# Patient Record
Sex: Female | Born: 1935 | Race: Black or African American | Hispanic: No | Marital: Single | Smoking: Never smoker
Health system: Southern US, Community
[De-identification: ages and names within clinical notes are randomized; demographics above are authoritative.]

## PROBLEM LIST (undated history)

## (undated) DIAGNOSIS — E119 Type 2 diabetes mellitus without complications: Secondary | ICD-10-CM

## (undated) DIAGNOSIS — I10 Essential (primary) hypertension: Secondary | ICD-10-CM

## (undated) HISTORY — PX: EYE SURGERY: SHX253

## (undated) HISTORY — PX: ABDOMINAL HYSTERECTOMY: SHX81

---

## 2000-07-30 ENCOUNTER — Encounter: Admission: RE | Admit: 2000-07-30 | Discharge: 2000-07-30 | Payer: Self-pay | Admitting: Urology

## 2000-07-30 ENCOUNTER — Encounter: Payer: Self-pay | Admitting: Urology

## 2001-03-13 ENCOUNTER — Encounter: Payer: Self-pay | Admitting: Family Medicine

## 2001-03-13 ENCOUNTER — Ambulatory Visit (HOSPITAL_COMMUNITY): Admission: RE | Admit: 2001-03-13 | Discharge: 2001-03-13 | Payer: Self-pay | Admitting: Specialist

## 2001-05-22 ENCOUNTER — Ambulatory Visit (HOSPITAL_COMMUNITY): Admission: RE | Admit: 2001-05-22 | Discharge: 2001-05-22 | Payer: Self-pay | Admitting: General Surgery

## 2001-09-04 ENCOUNTER — Encounter: Payer: Self-pay | Admitting: Family Medicine

## 2001-09-04 ENCOUNTER — Ambulatory Visit (HOSPITAL_COMMUNITY): Admission: RE | Admit: 2001-09-04 | Discharge: 2001-09-04 | Payer: Self-pay | Admitting: Family Medicine

## 2002-02-11 ENCOUNTER — Encounter (HOSPITAL_COMMUNITY): Admission: RE | Admit: 2002-02-11 | Discharge: 2002-03-13 | Payer: Self-pay | Admitting: Family Medicine

## 2002-02-12 ENCOUNTER — Encounter: Payer: Self-pay | Admitting: Family Medicine

## 2002-03-14 ENCOUNTER — Encounter: Payer: Self-pay | Admitting: Family Medicine

## 2002-03-14 ENCOUNTER — Ambulatory Visit (HOSPITAL_COMMUNITY): Admission: RE | Admit: 2002-03-14 | Discharge: 2002-03-14 | Payer: Self-pay | Admitting: Family Medicine

## 2002-06-13 ENCOUNTER — Encounter: Payer: Self-pay | Admitting: Family Medicine

## 2002-06-14 ENCOUNTER — Inpatient Hospital Stay (HOSPITAL_COMMUNITY): Admission: AD | Admit: 2002-06-14 | Discharge: 2002-06-18 | Payer: Self-pay | Admitting: Family Medicine

## 2002-06-16 ENCOUNTER — Encounter: Payer: Self-pay | Admitting: Family Medicine

## 2002-10-14 ENCOUNTER — Ambulatory Visit (HOSPITAL_COMMUNITY): Admission: RE | Admit: 2002-10-14 | Discharge: 2002-10-14 | Payer: Self-pay | Admitting: Internal Medicine

## 2003-03-23 ENCOUNTER — Ambulatory Visit (HOSPITAL_COMMUNITY): Admission: RE | Admit: 2003-03-23 | Discharge: 2003-03-23 | Payer: Self-pay | Admitting: Specialist

## 2004-05-11 ENCOUNTER — Ambulatory Visit: Payer: Self-pay | Admitting: *Deleted

## 2004-05-23 ENCOUNTER — Ambulatory Visit (HOSPITAL_COMMUNITY): Admission: RE | Admit: 2004-05-23 | Discharge: 2004-05-23 | Payer: Self-pay | Admitting: Family Medicine

## 2004-05-24 ENCOUNTER — Ambulatory Visit: Payer: Self-pay

## 2004-06-09 ENCOUNTER — Ambulatory Visit: Payer: Self-pay | Admitting: *Deleted

## 2004-10-17 ENCOUNTER — Ambulatory Visit (HOSPITAL_COMMUNITY): Admission: RE | Admit: 2004-10-17 | Discharge: 2004-10-17 | Payer: Self-pay | Admitting: Internal Medicine

## 2004-12-21 ENCOUNTER — Ambulatory Visit: Payer: Self-pay | Admitting: *Deleted

## 2005-05-26 ENCOUNTER — Ambulatory Visit (HOSPITAL_COMMUNITY): Admission: RE | Admit: 2005-05-26 | Discharge: 2005-05-26 | Payer: Self-pay | Admitting: Family Medicine

## 2006-03-22 ENCOUNTER — Ambulatory Visit (HOSPITAL_COMMUNITY): Admission: RE | Admit: 2006-03-22 | Discharge: 2006-03-22 | Payer: Self-pay | Admitting: Internal Medicine

## 2010-08-23 ENCOUNTER — Other Ambulatory Visit: Payer: Self-pay | Admitting: Family Medicine

## 2010-08-23 ENCOUNTER — Ambulatory Visit
Admission: RE | Admit: 2010-08-23 | Discharge: 2010-08-23 | Disposition: A | Discharge status: Home / Self Care | Payer: Medicare Other | Source: Ambulatory Visit | Attending: Family Medicine | Admitting: Family Medicine

## 2010-08-23 DIAGNOSIS — R52 Pain, unspecified: Secondary | ICD-10-CM

## 2010-08-31 ENCOUNTER — Other Ambulatory Visit: Payer: Self-pay | Admitting: Family Medicine

## 2010-08-31 DIAGNOSIS — M25562 Pain in left knee: Secondary | ICD-10-CM

## 2010-09-03 ENCOUNTER — Ambulatory Visit
Admission: RE | Admit: 2010-09-03 | Discharge: 2010-09-03 | Disposition: A | Discharge status: Home / Self Care | Payer: Medicare Other | Source: Ambulatory Visit | Attending: Family Medicine | Admitting: Family Medicine

## 2010-09-03 DIAGNOSIS — M25562 Pain in left knee: Secondary | ICD-10-CM

## 2012-04-26 ENCOUNTER — Emergency Department (HOSPITAL_COMMUNITY): Payer: Medicare Other

## 2012-04-26 ENCOUNTER — Encounter (HOSPITAL_COMMUNITY): Payer: Self-pay | Admitting: Emergency Medicine

## 2012-04-26 ENCOUNTER — Emergency Department (HOSPITAL_COMMUNITY)
Admission: EM | Admit: 2012-04-26 | Discharge: 2012-04-26 | Disposition: A | Discharge status: Home / Self Care | Payer: Medicare Other | Attending: Emergency Medicine | Admitting: Emergency Medicine

## 2012-04-26 DIAGNOSIS — Y93I9 Activity, other involving external motion: Secondary | ICD-10-CM | POA: Insufficient documentation

## 2012-04-26 DIAGNOSIS — Y92410 Unspecified street and highway as the place of occurrence of the external cause: Secondary | ICD-10-CM | POA: Insufficient documentation

## 2012-04-26 DIAGNOSIS — S20219A Contusion of unspecified front wall of thorax, initial encounter: Secondary | ICD-10-CM | POA: Insufficient documentation

## 2012-04-26 DIAGNOSIS — S99919A Unspecified injury of unspecified ankle, initial encounter: Secondary | ICD-10-CM

## 2012-04-26 DIAGNOSIS — E119 Type 2 diabetes mellitus without complications: Secondary | ICD-10-CM | POA: Insufficient documentation

## 2012-04-26 DIAGNOSIS — Z79899 Other long term (current) drug therapy: Secondary | ICD-10-CM | POA: Insufficient documentation

## 2012-04-26 DIAGNOSIS — I10 Essential (primary) hypertension: Secondary | ICD-10-CM | POA: Insufficient documentation

## 2012-04-26 DIAGNOSIS — S8990XA Unspecified injury of unspecified lower leg, initial encounter: Secondary | ICD-10-CM | POA: Insufficient documentation

## 2012-04-26 HISTORY — DX: Essential (primary) hypertension: I10

## 2012-04-26 HISTORY — DX: Type 2 diabetes mellitus without complications: E11.9

## 2012-04-26 MED ORDER — HYDROCODONE-ACETAMINOPHEN 5-325 MG PO TABS: 1.0000 | ORAL_TABLET | Freq: Four times a day (QID) | ORAL | Status: DC | PRN

## 2012-04-26 NOTE — Discharge Instructions (Signed)
Follow up with your md next week if needed °
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
## 2012-05-13 ENCOUNTER — Other Ambulatory Visit: Payer: Self-pay | Admitting: Family Medicine

## 2012-05-13 DIAGNOSIS — R609 Edema, unspecified: Secondary | ICD-10-CM

## 2012-05-14 ENCOUNTER — Ambulatory Visit
Admission: RE | Admit: 2012-05-14 | Discharge: 2012-05-14 | Disposition: A | Discharge status: Home / Self Care | Payer: Medicare Other | Source: Ambulatory Visit | Attending: Family Medicine | Admitting: Family Medicine

## 2012-05-14 ENCOUNTER — Other Ambulatory Visit: Payer: Self-pay

## 2012-05-14 ENCOUNTER — Other Ambulatory Visit: Payer: Self-pay | Admitting: Family Medicine

## 2012-05-14 DIAGNOSIS — R52 Pain, unspecified: Secondary | ICD-10-CM

## 2012-06-22 IMAGING — CR DG KNEE 1-2V*L*
2 series · 2 of 2 positions shown · non-contrast
Comparison: None

CLINICAL DATA: Pain

LEFT KNEE - 1-2 VIEW

[t knee ap left]
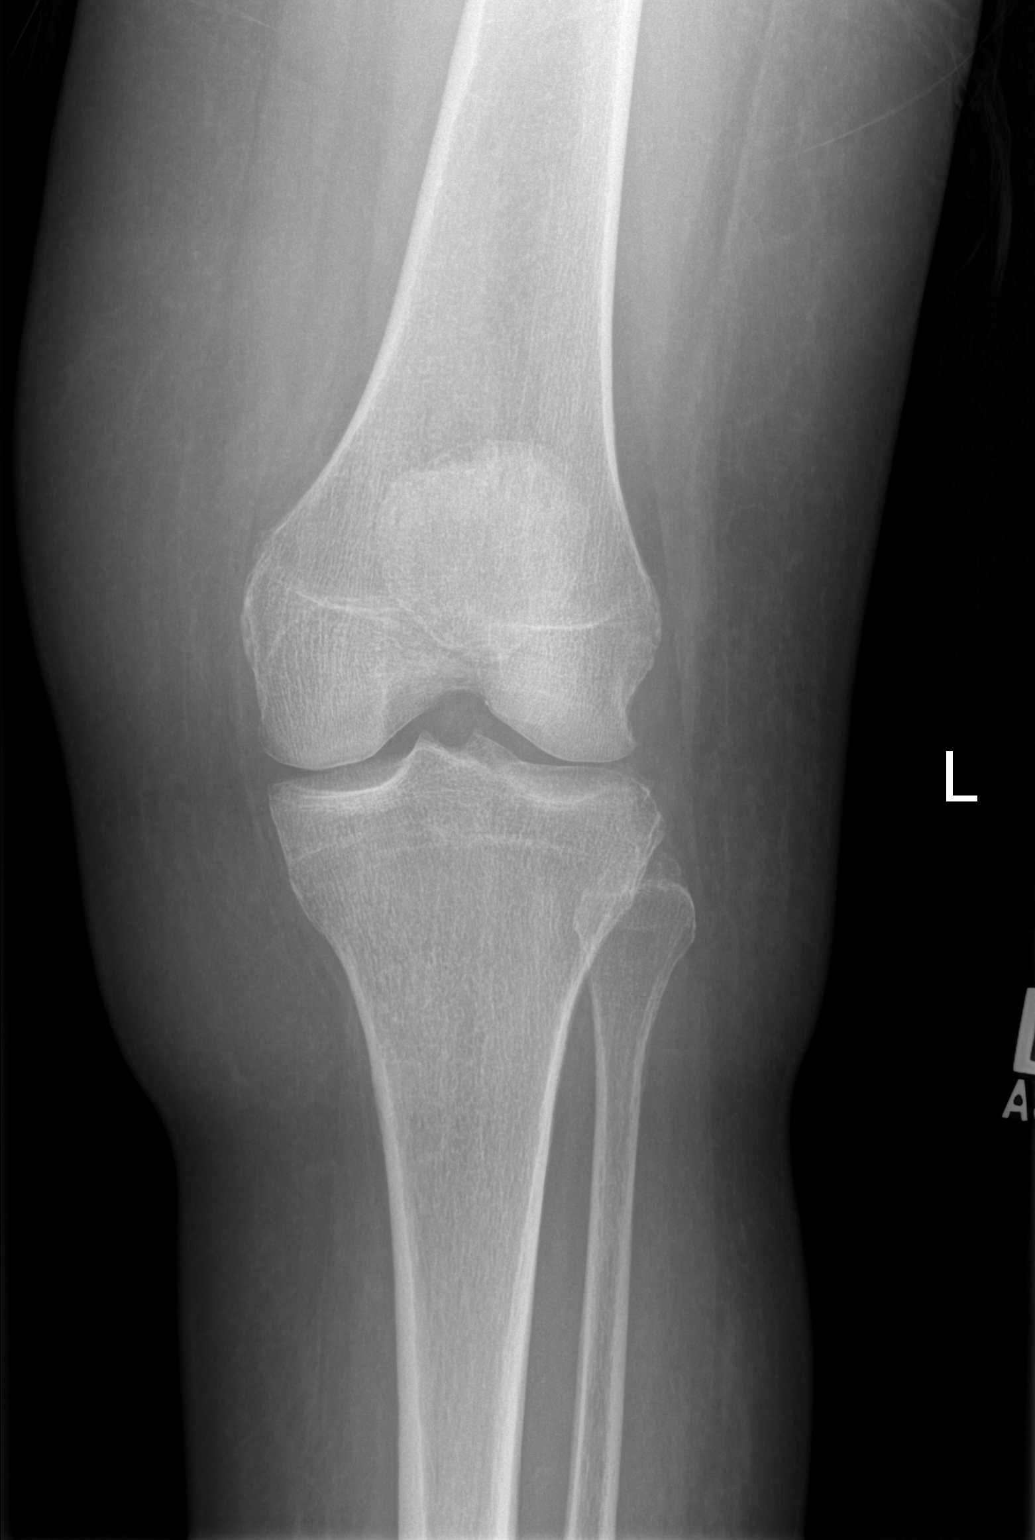

[t knee oblique left]
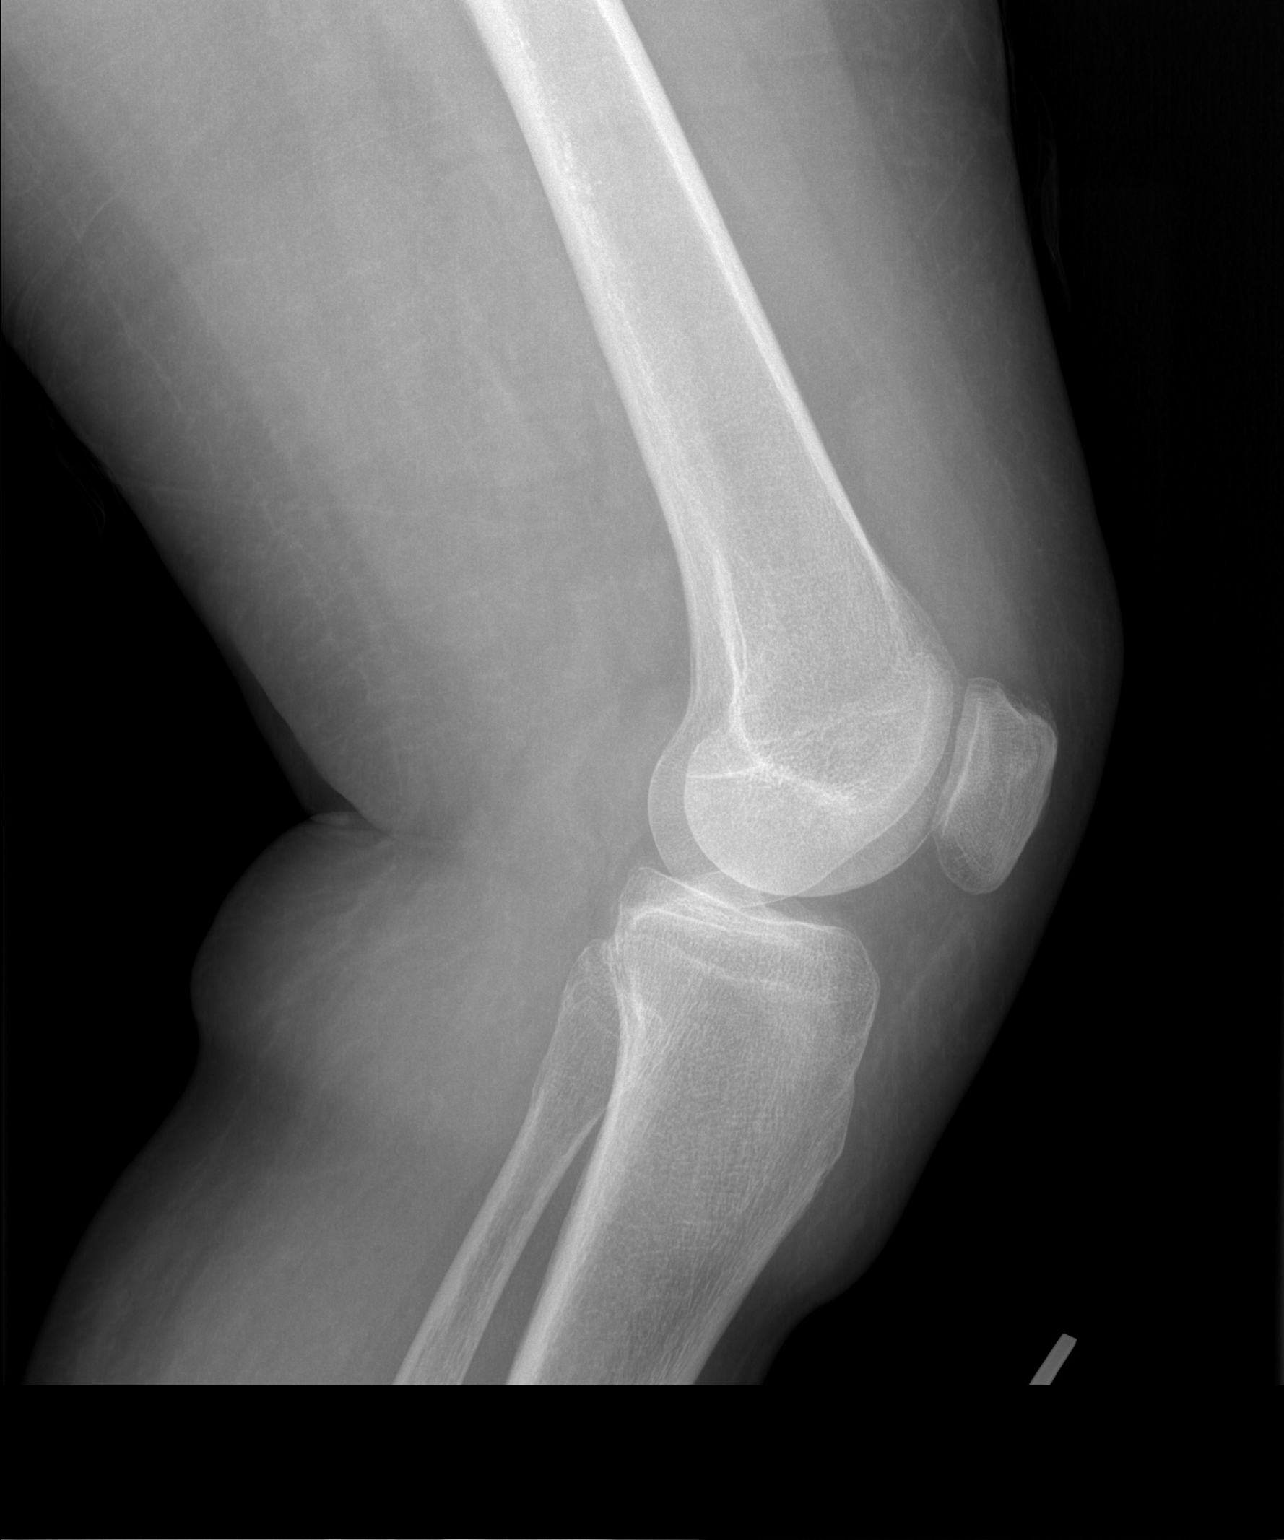

[2 of 2 positions shown; findings below may reference images not displayed]

FINDINGS: There is mild osteophytosis.  The joint compartments are
maintained.  There is no evidence of fracture, dislocation, or
subchondral lesion.  No gross suprapatellar effusion is noted.
IMPRESSION: Mild osteophytosis.  If there is clinical concern regarding a
meniscal or ligamentous injury, MRI may be of help.

## 2014-04-23 ENCOUNTER — Emergency Department (HOSPITAL_COMMUNITY): Payer: Medicare Other

## 2014-04-23 ENCOUNTER — Emergency Department (HOSPITAL_COMMUNITY)
Admission: EM | Admit: 2014-04-23 | Discharge: 2014-04-23 | Disposition: A | Discharge status: Home / Self Care | Payer: Medicare Other | Attending: Emergency Medicine | Admitting: Emergency Medicine

## 2014-04-23 ENCOUNTER — Encounter (HOSPITAL_COMMUNITY): Payer: Self-pay | Admitting: Family Medicine

## 2014-04-23 DIAGNOSIS — E119 Type 2 diabetes mellitus without complications: Secondary | ICD-10-CM | POA: Diagnosis not present

## 2014-04-23 DIAGNOSIS — N390 Urinary tract infection, site not specified: Secondary | ICD-10-CM | POA: Insufficient documentation

## 2014-04-23 DIAGNOSIS — R1012 Left upper quadrant pain: Secondary | ICD-10-CM | POA: Insufficient documentation

## 2014-04-23 DIAGNOSIS — R109 Unspecified abdominal pain: Secondary | ICD-10-CM

## 2014-04-23 DIAGNOSIS — R1110 Vomiting, unspecified: Secondary | ICD-10-CM | POA: Diagnosis present

## 2014-04-23 DIAGNOSIS — I10 Essential (primary) hypertension: Secondary | ICD-10-CM | POA: Diagnosis not present

## 2014-04-23 DIAGNOSIS — R1030 Lower abdominal pain, unspecified: Secondary | ICD-10-CM

## 2014-04-23 LAB — COMPREHENSIVE METABOLIC PANEL
ALK PHOS: 71 U/L (ref 39–117)
ALT: 19 U/L (ref 0–35)
AST: 32 U/L (ref 0–37)
Albumin: 3.7 g/dL (ref 3.5–5.2)
Anion gap: 8 (ref 5–15)
BUN: 14 mg/dL (ref 6–23)
CHLORIDE: 105 meq/L (ref 96–112)
CO2: 27 mmol/L (ref 19–32)
Calcium: 9.2 mg/dL (ref 8.4–10.5)
Creatinine, Ser: 0.67 mg/dL (ref 0.50–1.10)
GFR, EST AFRICAN AMERICAN: 89 mL/min — AB (ref >90)
GFR, EST NON AFRICAN AMERICAN: 77 mL/min — AB (ref >90)
GLUCOSE: 174 mg/dL — AB (ref 70–99)
Potassium: 3.6 mmol/L (ref 3.5–5.1)
SODIUM: 140 mmol/L (ref 135–145)
TOTAL PROTEIN: 7.5 g/dL (ref 6.0–8.3)
Total Bilirubin: 0.5 mg/dL (ref 0.3–1.2)

## 2014-04-23 LAB — URINE MICROSCOPIC-ADD ON

## 2014-04-23 LAB — URINALYSIS, ROUTINE W REFLEX MICROSCOPIC
Bilirubin Urine: NEGATIVE
GLUCOSE, UA: NEGATIVE mg/dL
KETONES UR: NEGATIVE mg/dL
LEUKOCYTES UA: NEGATIVE
Nitrite: POSITIVE — AB
PROTEIN: NEGATIVE mg/dL
Specific Gravity, Urine: 1.021 (ref 1.005–1.030)
Urobilinogen, UA: 0.2 mg/dL (ref 0.0–1.0)
pH: 6 (ref 5.0–8.0)

## 2014-04-23 LAB — CBC WITH DIFFERENTIAL/PLATELET
BASOS ABS: 0 10*3/uL (ref 0.0–0.1)
BASOS PCT: 0 % (ref 0–1)
Eosinophils Absolute: 0 10*3/uL (ref 0.0–0.7)
Eosinophils Relative: 0 % (ref 0–5)
HCT: 38.9 % (ref 36.0–46.0)
Hemoglobin: 13 g/dL (ref 12.0–15.0)
LYMPHS PCT: 7 % — AB (ref 12–46)
Lymphs Abs: 0.8 10*3/uL (ref 0.7–4.0)
MCH: 29.7 pg (ref 26.0–34.0)
MCHC: 33.4 g/dL (ref 30.0–36.0)
MCV: 88.8 fL (ref 78.0–100.0)
MONO ABS: 0.8 10*3/uL (ref 0.1–1.0)
Monocytes Relative: 7 % (ref 3–12)
NEUTROS ABS: 9.8 10*3/uL — AB (ref 1.7–7.7)
NEUTROS PCT: 86 % — AB (ref 43–77)
PLATELETS: 225 10*3/uL (ref 150–400)
RBC: 4.38 MIL/uL (ref 3.87–5.11)
RDW: 13.5 % (ref 11.5–15.5)
WBC: 11.4 10*3/uL — AB (ref 4.0–10.5)

## 2014-04-23 MED ORDER — CEPHALEXIN 500 MG PO CAPS: 500.0000 mg | ORAL_CAPSULE | Freq: Four times a day (QID) | ORAL | Status: DC

## 2014-04-23 MED ORDER — ONDANSETRON 4 MG PO TBDP
8.0000 mg | ORAL_TABLET | Freq: Once | ORAL | Status: AC
  Administered 2014-04-23: 8 mg via ORAL
  Filled 2014-04-23: qty 2

## 2014-04-23 MED ORDER — IOHEXOL 300 MG/ML  SOLN
25.0000 mL | Freq: Once | INTRAMUSCULAR | Status: AC | PRN
  Administered 2014-04-23: 25 mL via ORAL

## 2014-04-23 MED ORDER — IOHEXOL 300 MG/ML  SOLN
100.0000 mL | Freq: Once | INTRAMUSCULAR | Status: AC | PRN
  Administered 2014-04-23: 100 mL via INTRAVENOUS

## 2014-04-23 MED ORDER — SODIUM CHLORIDE 0.9 % IV BOLUS (SEPSIS)
1000.0000 mL | Freq: Once | INTRAVENOUS | Status: AC
  Administered 2014-04-23: 1000 mL via INTRAVENOUS

## 2014-04-23 MED ORDER — ONDANSETRON HCL 4 MG PO TABS: 4.0000 mg | ORAL_TABLET | Freq: Four times a day (QID) | ORAL | Status: DC

## 2014-04-23 NOTE — ED Notes (Signed)
Pt sts lower abd pain and vomiting since this am. Denies diarrhea.

## 2014-04-23 NOTE — ED Provider Notes (Signed)
CSN: [id number]     Arrival date & time 04/23/14  1219 History   First MD Initiated Contact with Patient 04/23/14 1503     Chief Complaint  Patient presents with  . Emesis     (Consider location/radiation/quality/duration/timing/severity/associated sxs/prior Treatment) HPI Comments: Patient with PMH of HTN and DM presents to the ED with a chief complaint of abdominal pain and vomiting.  She states that the symptoms started on Christmas Eve and lasted for the entire day.  She states that the symptoms then improved until today.  She reports multiple episodes of non-bloody vomiting.  She reports associated lower abdominal pain.  She denies any chest pain, SOB, cough, diarrhea, constipation, or dysuria.  She has taken zofran in triage today, but otherwise nothing for her symptoms.  She denies any hx of abdominal surgery.  She states that the pain is moderate.  There are no other associated symptoms.  The history is provided by the patient. No language interpreter was used.    Past Medical History  Diagnosis Date  . Hypertension   . Diabetes mellitus without complication    Past Surgical History  Procedure Laterality Date  . Abdominal hysterectomy     History reviewed. No pertinent family history. History  Substance Use Topics  . Smoking status: Never Smoker   . Smokeless tobacco: Not on file  . Alcohol Use: No   OB History    No data available     Review of Systems  Constitutional: Negative for fever and chills.  Respiratory: Negative for shortness of breath.   Cardiovascular: Negative for chest pain.  Gastrointestinal: Positive for nausea, vomiting and abdominal pain. Negative for diarrhea, constipation and abdominal distention.  Genitourinary: Negative for dysuria.  All other systems reviewed and are negative.     Allergies  Ace inhibitors  Home Medications   Prior to Admission medications   Not on File   BP 180/85 mmHg  Pulse 92  Temp(Src) 97.9 F (36.6 C)  (Oral)  Resp 18  SpO2 97% Physical Exam  Constitutional: She is oriented to person, place, and time. She appears well-developed and well-nourished.  HENT:  Head: Normocephalic and atraumatic.  Eyes: Conjunctivae and EOM are normal. Pupils are equal, round, and reactive to light.  Neck: Normal range of motion. Neck supple.  Cardiovascular: Normal rate and regular rhythm.  Exam reveals no gallop and no friction rub.   No murmur heard. Pulmonary/Chest: Effort normal and breath sounds normal. No respiratory distress. She has no wheezes. She has no rales. She exhibits no tenderness.  Abdominal: Soft. Bowel sounds are normal. She exhibits no distension and no mass. There is tenderness. There is no rebound and no guarding.  Moderate bilateral lower abdominal tenderness, moderate LUQ tenderness  Musculoskeletal: Normal range of motion. She exhibits edema. She exhibits no tenderness.  Bilateral lower extremity edema (baseline x years)  Neurological: She is alert and oriented to person, place, and time.  Skin: Skin is warm and dry.  Psychiatric: She has a normal mood and affect. Her behavior is normal. Judgment and thought content normal.  Nursing note and vitals reviewed.   ED Course  Procedures (including critical care time) Results for orders placed or performed during the hospital encounter of 04/23/14  CBC with Differential  Result Value Ref Range   WBC 11.4 (H) 4.0 - 10.5 K/uL   RBC 4.38 3.87 - 5.11 MIL/uL   Hemoglobin 13.0 12.0 - 15.0 g/dL   HCT 08.6 57.8 - 46.9 %  MCV 88.8 78.0 - 100.0 fL   MCH 29.7 26.0 - 34.0 pg   MCHC 33.4 30.0 - 36.0 g/dL   RDW 84.113.5 32.411.5 - 40.115.5 %   Platelets 225 150 - 400 K/uL   Neutrophils Relative % 86 (H) 43 - 77 %   Neutro Abs 9.8 (H) 1.7 - 7.7 K/uL   Lymphocytes Relative 7 (L) 12 - 46 %   Lymphs Abs 0.8 0.7 - 4.0 K/uL   Monocytes Relative 7 3 - 12 %   Monocytes Absolute 0.8 0.1 - 1.0 K/uL   Eosinophils Relative 0 0 - 5 %   Eosinophils Absolute 0.0  0.0 - 0.7 K/uL   Basophils Relative 0 0 - 1 %   Basophils Absolute 0.0 0.0 - 0.1 K/uL  Comprehensive metabolic panel  Result Value Ref Range   Sodium 140 135 - 145 mmol/L   Potassium 3.6 3.5 - 5.1 mmol/L   Chloride 105 96 - 112 mEq/L   CO2 27 19 - 32 mmol/L   Glucose, Bld 174 (H) 70 - 99 mg/dL   BUN 14 6 - 23 mg/dL   Creatinine, Ser 0.270.67 0.50 - 1.10 mg/dL   Calcium 9.2 8.4 - 25.310.5 mg/dL   Total Protein 7.5 6.0 - 8.3 g/dL   Albumin 3.7 3.5 - 5.2 g/dL   AST 32 0 - 37 U/L   ALT 19 0 - 35 U/L   Alkaline Phosphatase 71 39 - 117 U/L   Total Bilirubin 0.5 0.3 - 1.2 mg/dL   GFR calc non Af Amer 77 (L) >90 mL/min   GFR calc Af Amer 89 (L) >90 mL/min   Anion gap 8 5 - 15  Urinalysis, Routine w reflex microscopic  Result Value Ref Range   Color, Urine YELLOW YELLOW   APPearance HAZY (A) CLEAR   Specific Gravity, Urine 1.021 1.005 - 1.030   pH 6.0 5.0 - 8.0   Glucose, UA NEGATIVE NEGATIVE mg/dL   Hgb urine dipstick SMALL (A) NEGATIVE   Bilirubin Urine NEGATIVE NEGATIVE   Ketones, ur NEGATIVE NEGATIVE mg/dL   Protein, ur NEGATIVE NEGATIVE mg/dL   Urobilinogen, UA 0.2 0.0 - 1.0 mg/dL   Nitrite POSITIVE (A) NEGATIVE   Leukocytes, UA NEGATIVE NEGATIVE  Urine microscopic-add on  Result Value Ref Range   Squamous Epithelial / LPF RARE RARE   WBC, UA 0-2 <3 WBC/hpf   RBC / HPF 3-6 <3 RBC/hpf   Bacteria, UA MANY (A) RARE   Ct Abdomen Pelvis W Contrast  04/23/2014   CLINICAL DATA:  Initial evaluation for lower abdominal pain with nausea and vomiting.  EXAM: CT ABDOMEN AND PELVIS WITH CONTRAST  TECHNIQUE: Multidetector CT imaging of the abdomen and pelvis was performed using the standard protocol following bolus administration of intravenous contrast.  CONTRAST:  100mL OMNIPAQUE IOHEXOL 300 MG/ML  SOLN  COMPARISON:  None available.  FINDINGS: Tiny calcified granuloma present within the left lower lobe. There is a small 3 mm nodule within the right lower lobe (series 205, image 5). Lung bases  are otherwise clear.  Multiple probable cysts noted within the liver, the largest of which is present in the right hepatic lobe and measures 3.5 cm. Liver is otherwise unremarkable. Gallbladder within normal limits. No biliary dilatation. The spleen, adrenal glands, and pancreas demonstrate a normal contrast enhanced appearance.  Kidneys are equal in size with symmetric enhancement. No nephrolithiasis, hydronephrosis, or focal enhancing renal mass. Scattered subcentimeter hypodense lesions within the kidneys bilaterally are too small the  characterize by CT, but statistically likely represent small cysts.  Stomach within normal limits. No evidence for bowel obstruction. Small bowel within normal limits. Scattered sigmoid diverticula present without evidence for acute diverticulitis.  There is a focal area of mixed D mesenteric stranding within the left hemi abdomen, interspersed between several loops of small bowel (series 201, image 39). There is shotty subcentimeter mesenteric lymph nodes within this region measuring up to 7-8 mm in diameter. Finding favored to be reactive in nature.  Bladder within normal limits.  Uterus and ovaries not visualized.  No free air or fluid. No pathologically enlarged intra-abdominal pelvic lymph nodes. Normal intravascular enhancement seen throughout the abdomen and pelvis.  No acute osseous abnormality. No worrisome lytic or blastic osseous lesions. Degenerative disc desiccation present at L4-5 and L5-S1.  IMPRESSION: 1. Hazy fat stranding within the left abdomen with associated shotty subcentimeter mesenteric adenopathy. Finding most likely is reactive in nature to suspected underlying enteritis. 2. Sigmoid diverticulosis without acute diverticulitis. 3. 3 mm right lower lobe nodule, indeterminate. If the patient is at high risk for bronchogenic carcinoma, follow-up chest CT at 1 year is recommended. If the patient is at low risk, no follow-up is needed. This recommendation follows  the consensus statement: Guidelines for Management of Small Pulmonary Nodules Detected on CT Scans: A Statement from the Fleischner Society as published in Radiology 2005; 237:395-400.   Electronically Signed   By: Rise MuBenjamin  McClintock M.D.   On: 04/23/2014 17:35      EKG Interpretation None      MDM   Final diagnoses:  Abdominal pain  UTI (lower urinary tract infection)    Patient with abdominal pain and vomiting.  Patient does have some significant abdominal tenderness on exam.  Mild leukocytosis.  Will check CT abd.  UA pending.  Will treat with fluids and zofran.  Patient seen by and discussed with Dr. Madilyn Hookees, who agrees with plan. Will discharge home with Keflex and Zofran. Patient is very well-appearing, she is not in any apparent distress. Vital signs are stable. Return precautions given. Patient has tolerated oral intake in the emergency department. She is stable and ready for discharge.    Roxy Horsemanobert Victormanuel Mclure, PA-C 04/23/14 1749  Tilden FossaElizabeth Rees, MD 04/23/14 1901

## 2014-04-23 NOTE — ED Notes (Signed)
CT made aware patient has finished contrast 

## 2014-04-23 NOTE — Discharge Instructions (Signed)
Today your CT scan showed a non-specific 3mm lung nodule.  It is recommended that you have a repeat chest CT in 1 year.  Please discuss this with your primary care provider.  \ Urinary Tract Infection Urinary tract infections (UTIs) can develop anywhere along your urinary tract. Your urinary tract is your body's drainage system for removing wastes and extra water. Your urinary tract includes two kidneys, two ureters, a bladder, and a urethra. Your kidneys are a pair of bean-shaped organs. Each kidney is about the size of your fist. They are located below your ribs, one on each side of your spine. CAUSES Infections are caused by microbes, which are microscopic organisms, including fungi, viruses, and bacteria. These organisms are so small that they can only be seen through a microscope. Bacteria are the microbes that most commonly cause UTIs. SYMPTOMS  Symptoms of UTIs may vary by age and gender of the patient and by the location of the infection. Symptoms in young women typically include a frequent and intense urge to urinate and a painful, burning feeling in the bladder or urethra during urination. Older women and men are more likely to be tired, shaky, and weak and have muscle aches and abdominal pain. A fever may mean the infection is in your kidneys. Other symptoms of a kidney infection include pain in your back or sides below the ribs, nausea, and vomiting. DIAGNOSIS To diagnose a UTI, your caregiver will ask you about your symptoms. Your caregiver also will ask to provide a urine sample. The urine sample will be tested for bacteria and white blood cells. White blood cells are made by your body to help fight infection. TREATMENT  Typically, UTIs can be treated with medication. Because most UTIs are caused by a bacterial infection, they usually can be treated with the use of antibiotics. The choice of antibiotic and length of treatment depend on your symptoms and the type of bacteria causing your  infection. HOME CARE INSTRUCTIONS  If you were prescribed antibiotics, take them exactly as your caregiver instructs you. Finish the medication even if you feel better after you have only taken some of the medication.  Drink enough water and fluids to keep your urine clear or pale yellow.  Avoid caffeine, tea, and carbonated beverages. They tend to irritate your bladder.  Empty your bladder often. Avoid holding urine for long periods of time.  Empty your bladder before and after sexual intercourse.  After a bowel movement, women should cleanse from front to back. Use each tissue only once. SEEK MEDICAL CARE IF:   You have back pain.  You develop a fever.  Your symptoms do not begin to resolve within 3 days. SEEK IMMEDIATE MEDICAL CARE IF:   You have severe back pain or lower abdominal pain.  You develop chills.  You have nausea or vomiting.  You have continued burning or discomfort with urination. MAKE SURE YOU:   Understand these instructions.  Will watch your condition.  Will get help right away if you are not doing well or get worse. Document Released: 01/18/2005 Document Revised: 10/10/2011 Document Reviewed: 05/19/2011 [facility] Patient Information 2015 [place], [place]. This information is not intended to replace advice given to you by your health care provider. Make sure you discuss any questions you have with your health care provider.

## 2014-05-01 DIAGNOSIS — E119 Type 2 diabetes mellitus without complications: Secondary | ICD-10-CM | POA: Diagnosis not present

## 2014-05-01 DIAGNOSIS — N390 Urinary tract infection, site not specified: Secondary | ICD-10-CM | POA: Diagnosis not present

## 2014-05-12 DIAGNOSIS — R1031 Right lower quadrant pain: Secondary | ICD-10-CM | POA: Diagnosis not present

## 2014-05-12 DIAGNOSIS — I10 Essential (primary) hypertension: Secondary | ICD-10-CM | POA: Diagnosis not present

## 2014-05-18 ENCOUNTER — Encounter (HOSPITAL_COMMUNITY): Payer: Self-pay | Admitting: Emergency Medicine

## 2014-05-26 DIAGNOSIS — I10 Essential (primary) hypertension: Secondary | ICD-10-CM | POA: Diagnosis not present

## 2014-05-26 DIAGNOSIS — E119 Type 2 diabetes mellitus without complications: Secondary | ICD-10-CM | POA: Diagnosis not present

## 2014-06-02 DIAGNOSIS — I10 Essential (primary) hypertension: Secondary | ICD-10-CM | POA: Diagnosis not present

## 2014-07-01 ENCOUNTER — Other Ambulatory Visit: Payer: Self-pay | Admitting: Family Medicine

## 2014-07-01 ENCOUNTER — Ambulatory Visit
Admission: RE | Admit: 2014-07-01 | Discharge: 2014-07-01 | Disposition: A | Discharge status: Home / Self Care | Payer: Medicare Other | Source: Ambulatory Visit | Attending: Family Medicine | Admitting: Family Medicine

## 2014-07-01 DIAGNOSIS — E1149 Type 2 diabetes mellitus with other diabetic neurological complication: Secondary | ICD-10-CM | POA: Diagnosis not present

## 2014-07-01 DIAGNOSIS — R1084 Generalized abdominal pain: Secondary | ICD-10-CM

## 2014-07-01 DIAGNOSIS — I10 Essential (primary) hypertension: Secondary | ICD-10-CM | POA: Diagnosis not present

## 2014-07-01 DIAGNOSIS — R1032 Left lower quadrant pain: Secondary | ICD-10-CM

## 2014-07-01 DIAGNOSIS — R1030 Lower abdominal pain, unspecified: Secondary | ICD-10-CM | POA: Diagnosis not present

## 2014-07-03 ENCOUNTER — Other Ambulatory Visit: Payer: Self-pay

## 2014-07-06 DIAGNOSIS — E119 Type 2 diabetes mellitus without complications: Secondary | ICD-10-CM | POA: Diagnosis not present

## 2014-07-06 DIAGNOSIS — I10 Essential (primary) hypertension: Secondary | ICD-10-CM | POA: Diagnosis not present

## 2014-07-06 DIAGNOSIS — K5900 Constipation, unspecified: Secondary | ICD-10-CM | POA: Diagnosis not present

## 2014-07-28 DIAGNOSIS — I10 Essential (primary) hypertension: Secondary | ICD-10-CM | POA: Diagnosis not present

## 2014-07-28 DIAGNOSIS — E119 Type 2 diabetes mellitus without complications: Secondary | ICD-10-CM | POA: Diagnosis not present

## 2014-09-08 DIAGNOSIS — I10 Essential (primary) hypertension: Secondary | ICD-10-CM | POA: Diagnosis not present

## 2014-09-08 DIAGNOSIS — E118 Type 2 diabetes mellitus with unspecified complications: Secondary | ICD-10-CM | POA: Diagnosis not present

## 2014-10-20 DIAGNOSIS — E118 Type 2 diabetes mellitus with unspecified complications: Secondary | ICD-10-CM | POA: Diagnosis not present

## 2014-10-20 DIAGNOSIS — I10 Essential (primary) hypertension: Secondary | ICD-10-CM | POA: Diagnosis not present

## 2014-12-14 DIAGNOSIS — E119 Type 2 diabetes mellitus without complications: Secondary | ICD-10-CM | POA: Diagnosis not present

## 2014-12-14 DIAGNOSIS — I10 Essential (primary) hypertension: Secondary | ICD-10-CM | POA: Diagnosis not present

## 2015-01-19 DIAGNOSIS — I10 Essential (primary) hypertension: Secondary | ICD-10-CM | POA: Diagnosis not present

## 2015-01-19 DIAGNOSIS — E119 Type 2 diabetes mellitus without complications: Secondary | ICD-10-CM | POA: Diagnosis not present

## 2015-04-22 DIAGNOSIS — I10 Essential (primary) hypertension: Secondary | ICD-10-CM | POA: Diagnosis not present

## 2015-04-22 DIAGNOSIS — Z6831 Body mass index (BMI) 31.0-31.9, adult: Secondary | ICD-10-CM | POA: Diagnosis not present

## 2015-04-22 DIAGNOSIS — E118 Type 2 diabetes mellitus with unspecified complications: Secondary | ICD-10-CM | POA: Diagnosis not present

## 2015-06-22 DIAGNOSIS — Z0000 Encounter for general adult medical examination without abnormal findings: Secondary | ICD-10-CM | POA: Diagnosis not present

## 2015-06-22 DIAGNOSIS — I10 Essential (primary) hypertension: Secondary | ICD-10-CM | POA: Diagnosis not present

## 2015-06-22 DIAGNOSIS — E1165 Type 2 diabetes mellitus with hyperglycemia: Secondary | ICD-10-CM | POA: Diagnosis not present

## 2015-11-30 DIAGNOSIS — I10 Essential (primary) hypertension: Secondary | ICD-10-CM | POA: Diagnosis not present

## 2015-11-30 DIAGNOSIS — E1165 Type 2 diabetes mellitus with hyperglycemia: Secondary | ICD-10-CM | POA: Diagnosis not present

## 2015-12-20 DIAGNOSIS — E1165 Type 2 diabetes mellitus with hyperglycemia: Secondary | ICD-10-CM | POA: Diagnosis not present

## 2015-12-20 DIAGNOSIS — I10 Essential (primary) hypertension: Secondary | ICD-10-CM | POA: Diagnosis not present

## 2016-02-08 DIAGNOSIS — Z23 Encounter for immunization: Secondary | ICD-10-CM | POA: Diagnosis not present

## 2016-08-30 DIAGNOSIS — J219 Acute bronchiolitis, unspecified: Secondary | ICD-10-CM | POA: Diagnosis not present

## 2016-08-30 DIAGNOSIS — I10 Essential (primary) hypertension: Secondary | ICD-10-CM | POA: Diagnosis not present

## 2016-08-30 DIAGNOSIS — E1165 Type 2 diabetes mellitus with hyperglycemia: Secondary | ICD-10-CM | POA: Diagnosis not present

## 2016-09-11 DIAGNOSIS — E118 Type 2 diabetes mellitus with unspecified complications: Secondary | ICD-10-CM | POA: Diagnosis not present

## 2016-09-11 DIAGNOSIS — J219 Acute bronchiolitis, unspecified: Secondary | ICD-10-CM | POA: Diagnosis not present

## 2016-09-11 DIAGNOSIS — I10 Essential (primary) hypertension: Secondary | ICD-10-CM | POA: Diagnosis not present

## 2017-06-12 DIAGNOSIS — I10 Essential (primary) hypertension: Secondary | ICD-10-CM | POA: Diagnosis not present

## 2017-06-12 DIAGNOSIS — E1165 Type 2 diabetes mellitus with hyperglycemia: Secondary | ICD-10-CM | POA: Diagnosis not present

## 2017-08-27 DIAGNOSIS — D519 Vitamin B12 deficiency anemia, unspecified: Secondary | ICD-10-CM | POA: Diagnosis not present

## 2017-08-27 DIAGNOSIS — E1165 Type 2 diabetes mellitus with hyperglycemia: Secondary | ICD-10-CM | POA: Diagnosis not present

## 2017-08-27 DIAGNOSIS — I10 Essential (primary) hypertension: Secondary | ICD-10-CM | POA: Diagnosis not present

## 2017-08-28 DIAGNOSIS — I10 Essential (primary) hypertension: Secondary | ICD-10-CM | POA: Diagnosis not present

## 2017-08-28 DIAGNOSIS — E1165 Type 2 diabetes mellitus with hyperglycemia: Secondary | ICD-10-CM | POA: Diagnosis not present

## 2017-08-29 DIAGNOSIS — E1165 Type 2 diabetes mellitus with hyperglycemia: Secondary | ICD-10-CM | POA: Diagnosis not present

## 2017-08-29 DIAGNOSIS — I10 Essential (primary) hypertension: Secondary | ICD-10-CM | POA: Diagnosis not present

## 2017-08-31 ENCOUNTER — Encounter: Payer: Self-pay | Admitting: Psychology

## 2017-09-04 DIAGNOSIS — E118 Type 2 diabetes mellitus with unspecified complications: Secondary | ICD-10-CM | POA: Diagnosis not present

## 2017-09-04 DIAGNOSIS — I10 Essential (primary) hypertension: Secondary | ICD-10-CM | POA: Diagnosis not present

## 2017-11-06 ENCOUNTER — Ambulatory Visit: Payer: Self-pay | Admitting: Psychology

## 2017-12-17 ENCOUNTER — Encounter: Payer: Medicare Other | Attending: Psychology | Admitting: Psychology

## 2018-01-15 DIAGNOSIS — I10 Essential (primary) hypertension: Secondary | ICD-10-CM | POA: Diagnosis not present

## 2018-01-15 DIAGNOSIS — E1122 Type 2 diabetes mellitus with diabetic chronic kidney disease: Secondary | ICD-10-CM | POA: Diagnosis not present

## 2018-01-29 DIAGNOSIS — E118 Type 2 diabetes mellitus with unspecified complications: Secondary | ICD-10-CM | POA: Diagnosis not present

## 2018-01-29 DIAGNOSIS — I10 Essential (primary) hypertension: Secondary | ICD-10-CM | POA: Diagnosis not present

## 2018-01-30 DIAGNOSIS — I10 Essential (primary) hypertension: Secondary | ICD-10-CM | POA: Diagnosis not present

## 2018-01-30 DIAGNOSIS — E1165 Type 2 diabetes mellitus with hyperglycemia: Secondary | ICD-10-CM | POA: Diagnosis not present

## 2018-05-14 DIAGNOSIS — E1165 Type 2 diabetes mellitus with hyperglycemia: Secondary | ICD-10-CM | POA: Diagnosis not present

## 2018-05-14 DIAGNOSIS — Z0000 Encounter for general adult medical examination without abnormal findings: Secondary | ICD-10-CM | POA: Diagnosis not present

## 2018-05-14 DIAGNOSIS — I10 Essential (primary) hypertension: Secondary | ICD-10-CM | POA: Diagnosis not present

## 2018-05-27 DIAGNOSIS — E1165 Type 2 diabetes mellitus with hyperglycemia: Secondary | ICD-10-CM | POA: Diagnosis not present

## 2018-05-27 DIAGNOSIS — I10 Essential (primary) hypertension: Secondary | ICD-10-CM | POA: Diagnosis not present

## 2018-10-14 ENCOUNTER — Emergency Department (HOSPITAL_COMMUNITY): Payer: Medicare Other

## 2018-10-14 ENCOUNTER — Encounter (HOSPITAL_COMMUNITY): Payer: Self-pay

## 2018-10-14 ENCOUNTER — Inpatient Hospital Stay (HOSPITAL_COMMUNITY)
Admission: EM | Admit: 2018-10-14 | Discharge: 2018-10-16 | DRG: 872 | Disposition: A | Discharge status: Home Health | Payer: Medicare Other | Attending: Internal Medicine | Admitting: Internal Medicine

## 2018-10-14 ENCOUNTER — Other Ambulatory Visit: Payer: Self-pay

## 2018-10-14 DIAGNOSIS — E876 Hypokalemia: Secondary | ICD-10-CM | POA: Diagnosis present

## 2018-10-14 DIAGNOSIS — Z79899 Other long term (current) drug therapy: Secondary | ICD-10-CM

## 2018-10-14 DIAGNOSIS — R4182 Altered mental status, unspecified: Secondary | ICD-10-CM | POA: Diagnosis not present

## 2018-10-14 DIAGNOSIS — E119 Type 2 diabetes mellitus without complications: Secondary | ICD-10-CM

## 2018-10-14 DIAGNOSIS — R531 Weakness: Secondary | ICD-10-CM

## 2018-10-14 DIAGNOSIS — E1165 Type 2 diabetes mellitus with hyperglycemia: Secondary | ICD-10-CM | POA: Diagnosis present

## 2018-10-14 DIAGNOSIS — Z87440 Personal history of urinary (tract) infections: Secondary | ICD-10-CM

## 2018-10-14 DIAGNOSIS — E860 Dehydration: Secondary | ICD-10-CM | POA: Diagnosis present

## 2018-10-14 DIAGNOSIS — A4151 Sepsis due to Escherichia coli [E. coli]: Principal | ICD-10-CM | POA: Diagnosis present

## 2018-10-14 DIAGNOSIS — I361 Nonrheumatic tricuspid (valve) insufficiency: Secondary | ICD-10-CM | POA: Diagnosis not present

## 2018-10-14 DIAGNOSIS — Z1159 Encounter for screening for other viral diseases: Secondary | ICD-10-CM | POA: Diagnosis not present

## 2018-10-14 DIAGNOSIS — L89151 Pressure ulcer of sacral region, stage 1: Secondary | ICD-10-CM | POA: Diagnosis present

## 2018-10-14 DIAGNOSIS — N390 Urinary tract infection, site not specified: Secondary | ICD-10-CM | POA: Diagnosis present

## 2018-10-14 DIAGNOSIS — L8990 Pressure ulcer of unspecified site, unspecified stage: Secondary | ICD-10-CM | POA: Insufficient documentation

## 2018-10-14 DIAGNOSIS — M7989 Other specified soft tissue disorders: Secondary | ICD-10-CM | POA: Diagnosis present

## 2018-10-14 DIAGNOSIS — R319 Hematuria, unspecified: Secondary | ICD-10-CM | POA: Diagnosis not present

## 2018-10-14 DIAGNOSIS — N179 Acute kidney failure, unspecified: Secondary | ICD-10-CM | POA: Diagnosis present

## 2018-10-14 DIAGNOSIS — E872 Acidosis: Secondary | ICD-10-CM | POA: Diagnosis not present

## 2018-10-14 DIAGNOSIS — R29898 Other symptoms and signs involving the musculoskeletal system: Secondary | ICD-10-CM | POA: Diagnosis not present

## 2018-10-14 DIAGNOSIS — B9620 Unspecified Escherichia coli [E. coli] as the cause of diseases classified elsewhere: Secondary | ICD-10-CM | POA: Diagnosis not present

## 2018-10-14 DIAGNOSIS — A419 Sepsis, unspecified organism: Secondary | ICD-10-CM | POA: Diagnosis not present

## 2018-10-14 DIAGNOSIS — R404 Transient alteration of awareness: Secondary | ICD-10-CM | POA: Diagnosis not present

## 2018-10-14 DIAGNOSIS — I10 Essential (primary) hypertension: Secondary | ICD-10-CM | POA: Diagnosis present

## 2018-10-14 LAB — CBC WITH DIFFERENTIAL/PLATELET
Abs Immature Granulocytes: 0.97 10*3/uL — ABNORMAL HIGH (ref 0.00–0.07)
Basophils Absolute: 0 10*3/uL (ref 0.0–0.1)
Basophils Relative: 0 %
Eosinophils Absolute: 0.2 10*3/uL (ref 0.0–0.5)
Eosinophils Relative: 1 %
HCT: 34.4 % — ABNORMAL LOW (ref 36.0–46.0)
Hemoglobin: 11.1 g/dL — ABNORMAL LOW (ref 12.0–15.0)
Immature Granulocytes: 5 %
Lymphocytes Relative: 2 %
Lymphs Abs: 0.4 10*3/uL — ABNORMAL LOW (ref 0.7–4.0)
MCH: 30.6 pg (ref 26.0–34.0)
MCHC: 32.3 g/dL (ref 30.0–36.0)
MCV: 94.8 fL (ref 80.0–100.0)
Monocytes Absolute: 0.7 10*3/uL (ref 0.1–1.0)
Monocytes Relative: 4 %
Neutro Abs: 15.7 10*3/uL — ABNORMAL HIGH (ref 1.7–7.7)
Neutrophils Relative %: 88 %
Platelets: 150 10*3/uL (ref 150–400)
RBC: 3.63 MIL/uL — ABNORMAL LOW (ref 3.87–5.11)
RDW: 13.2 % (ref 11.5–15.5)
WBC: 18 10*3/uL — ABNORMAL HIGH (ref 4.0–10.5)
nRBC: 0 % (ref 0.0–0.2)

## 2018-10-14 LAB — URINALYSIS, ROUTINE W REFLEX MICROSCOPIC
Bilirubin Urine: NEGATIVE
Glucose, UA: >=500 mg/dL — AB
Ketones, ur: 5 mg/dL — AB
Nitrite: POSITIVE — AB
Protein, ur: 30 mg/dL — AB
Specific Gravity, Urine: 1.008 (ref 1.005–1.030)
pH: 6 (ref 5.0–8.0)

## 2018-10-14 LAB — COMPREHENSIVE METABOLIC PANEL
ALT: 17 U/L (ref 0–44)
AST: 29 U/L (ref 15–41)
Albumin: 3.6 g/dL (ref 3.5–5.0)
Alkaline Phosphatase: 99 U/L (ref 38–126)
Anion gap: 16 — ABNORMAL HIGH (ref 5–15)
BUN: 45 mg/dL — ABNORMAL HIGH (ref 8–23)
CO2: 23 mmol/L (ref 22–32)
Calcium: 8.8 mg/dL — ABNORMAL LOW (ref 8.9–10.3)
Chloride: 93 mmol/L — ABNORMAL LOW (ref 98–111)
Creatinine, Ser: 1.92 mg/dL — ABNORMAL HIGH (ref 0.44–1.00)
GFR calc Af Amer: 26 mL/min — ABNORMAL LOW (ref >60)
GFR calc non Af Amer: 22 mL/min — ABNORMAL LOW (ref >60)
Glucose, Bld: 224 mg/dL — ABNORMAL HIGH (ref 70–99)
Potassium: 3.3 mmol/L — ABNORMAL LOW (ref 3.5–5.1)
Sodium: 132 mmol/L — ABNORMAL LOW (ref 135–145)
Total Bilirubin: 1.8 mg/dL — ABNORMAL HIGH (ref 0.3–1.2)
Total Protein: 8.4 g/dL — ABNORMAL HIGH (ref 6.5–8.1)

## 2018-10-14 LAB — PROTIME-INR
INR: 1.5 — ABNORMAL HIGH (ref 0.8–1.2)
Prothrombin Time: 17.5 seconds — ABNORMAL HIGH (ref 11.4–15.2)

## 2018-10-14 LAB — SARS CORONAVIRUS 2 BY RT PCR (HOSPITAL ORDER, PERFORMED IN ~~LOC~~ HOSPITAL LAB): SARS Coronavirus 2: NEGATIVE

## 2018-10-14 LAB — LACTIC ACID, PLASMA
Lactic Acid, Venous: 2.5 mmol/L (ref 0.5–1.9)
Lactic Acid, Venous: 2.2 mmol/L (ref 0.5–1.9)

## 2018-10-14 LAB — GLUCOSE, CAPILLARY: Glucose-Capillary: 220 mg/dL — ABNORMAL HIGH (ref 70–99)

## 2018-10-14 LAB — CBG MONITORING, ED
Glucose-Capillary: 183 mg/dL — ABNORMAL HIGH (ref 70–99)
Glucose-Capillary: 223 mg/dL — ABNORMAL HIGH (ref 70–99)

## 2018-10-14 LAB — TROPONIN I
Troponin I: 0.06 ng/mL (ref <0.03)
Troponin I: 0.06 ng/mL (ref <0.03)

## 2018-10-14 MED ORDER — INSULIN ASPART 100 UNIT/ML ~~LOC~~ SOLN
0.0000 [IU] | Freq: Every day | SUBCUTANEOUS | Status: DC
  Administered 2018-10-14: 22:00:00 2 [IU] via SUBCUTANEOUS
  Filled 2018-10-14: qty 0.05

## 2018-10-14 MED ORDER — SODIUM CHLORIDE 0.9 % IV SOLN: 1.0000 g | INTRAVENOUS | Status: DC

## 2018-10-14 MED ORDER — VANCOMYCIN HCL IN DEXTROSE 1-5 GM/200ML-% IV SOLN
1000.0000 mg | Freq: Once | INTRAVENOUS | Status: AC
  Administered 2018-10-14: 1000 mg via INTRAVENOUS
  Filled 2018-10-14: qty 200

## 2018-10-14 MED ORDER — POTASSIUM CHLORIDE CRYS ER 20 MEQ PO TBCR
40.0000 meq | EXTENDED_RELEASE_TABLET | Freq: Once | ORAL | Status: AC
  Administered 2018-10-14: 18:00:00 40 meq via ORAL
  Filled 2018-10-14: qty 2

## 2018-10-14 MED ORDER — METRONIDAZOLE IN NACL 5-0.79 MG/ML-% IV SOLN
500.0000 mg | Freq: Once | INTRAVENOUS | Status: AC
  Administered 2018-10-14: 500 mg via INTRAVENOUS
  Filled 2018-10-14: qty 100

## 2018-10-14 MED ORDER — SODIUM CHLORIDE 0.9 % IV SOLN
1.0000 g | INTRAVENOUS | Status: DC
  Administered 2018-10-14: 20:00:00 1 g via INTRAVENOUS
  Filled 2018-10-14: qty 1

## 2018-10-14 MED ORDER — INSULIN ASPART 100 UNIT/ML ~~LOC~~ SOLN
0.0000 [IU] | Freq: Three times a day (TID) | SUBCUTANEOUS | Status: DC
  Administered 2018-10-15: 1 [IU] via SUBCUTANEOUS
  Administered 2018-10-15 (×2): 2 [IU] via SUBCUTANEOUS
  Administered 2018-10-16: 1 [IU] via SUBCUTANEOUS
  Filled 2018-10-14: qty 0.09

## 2018-10-14 MED ORDER — ACETAMINOPHEN 650 MG RE SUPP
650.0000 mg | Freq: Once | RECTAL | Status: AC
  Administered 2018-10-14: 650 mg via RECTAL
  Filled 2018-10-14: qty 1

## 2018-10-14 MED ORDER — ACETAMINOPHEN 500 MG PO TABS: 500.0000 mg | ORAL_TABLET | Freq: Four times a day (QID) | ORAL | Status: DC | PRN

## 2018-10-14 MED ORDER — SODIUM CHLORIDE 0.9% FLUSH
3.0000 mL | Freq: Once | INTRAVENOUS | Status: AC
  Administered 2018-10-14: 11:00:00 3 mL via INTRAVENOUS

## 2018-10-14 MED ORDER — SODIUM CHLORIDE 0.9 % IV SOLN
1000.0000 mL | INTRAVENOUS | Status: DC
  Administered 2018-10-14 (×2): 1000 mL via INTRAVENOUS

## 2018-10-14 MED ORDER — SODIUM CHLORIDE 0.9 % IV SOLN
2.0000 g | Freq: Once | INTRAVENOUS | Status: AC
  Administered 2018-10-14: 12:00:00 2 g via INTRAVENOUS
  Filled 2018-10-14: qty 2

## 2018-10-14 NOTE — ED Triage Notes (Signed)
Transported by [facility] from home-- increasing AMS x 3 weeks that has gotten worse over the last 2 days. Family concerned about UTI. +weakness, fever.

## 2018-10-14 NOTE — ED Notes (Signed)
ED TO INPATIENT HANDOFF REPORT  ED Nurse Name and Phone #: [phone]  S Name/Age/Gender Chloe Bradley 85 y.o. female Room/Bed: WA17/WA17  Code Status   Code Status: Full Code  Home/SNF/Other Home Patient oriented to: self, place and time Is this baseline? Yes   Triage Complete: Triage complete  Chief Complaint AMS  Triage Note Transported by [facility] from home-- increasing AMS x 3 weeks that has gotten worse over the last 2 days. Family concerned about UTI. +weakness, fever.    Allergies No Known Allergies  Level of Care/Admitting Diagnosis ED Disposition    ED Disposition Condition Comment   Admit  Hospital Area: [facility] [[id number]]  Level of Care: Med-Surg [16]  Covid Evaluation: Confirmed COVID Negative  Diagnosis: UTI (urinary tract infection) [322025]  Admitting Physician: Shelly Coss [[id number]]  Attending Physician: Shelly Coss [[id number]]  PT Class (Do Not Modify): Observation [104]  PT Acc Code (Do Not Modify): Observation [10022]       B Medical/Surgery History Past Medical History:  Diagnosis Date  . Diabetes mellitus without complication [facility])    Past Surgical History:  Procedure Laterality Date  . ABDOMINAL HYSTERECTOMY    . EYE SURGERY     bilateral cataract extraction     A IV Location/Drains/Wounds Patient Lines/Drains/Airways Status   Active Line/Drains/Airways    Name:   Placement date:   Placement time:   Site:   Days:   Peripheral IV 10/14/18 Right Antecubital   10/14/18    1110    Antecubital   less than 1          Intake/Output Last 24 hours No intake or output data in the 24 hours ending 10/14/18 1859  Labs/Imaging Results for orders placed or performed during the hospital encounter of 10/14/18 (from the past 48 hour(s))  CBG monitoring, ED     Status: Abnormal   Collection Time: 10/14/18 10:54 AM  Result Value Ref Range   Glucose-Capillary 183 (H) 70 - 99 mg/dL  Lactic acid, plasma     Status:  Abnormal   Collection Time: 10/14/18 11:11 AM  Result Value Ref Range   Lactic Acid, Venous 2.5 (HH) 0.5 - 1.9 mmol/L    Comment: CRITICAL RESULT CALLED TO, READ BACK BY AND VERIFIED WITHKathryne Sharper RN AT 1154 10/14/18 MULLINS,T Performed at [facility], [place] [address], [place], [place] [address]   Urinalysis, Routine w reflex microscopic     Status: Abnormal   Collection Time: 10/14/18 11:11 AM  Result Value Ref Range   Color, Urine YELLOW YELLOW   APPearance CLEAR CLEAR   Specific Gravity, Urine 1.008 1.005 - 1.030   pH 6.0 5.0 - 8.0   Glucose, UA >=500 (A) NEGATIVE mg/dL   Hgb urine dipstick MODERATE (A) NEGATIVE   Bilirubin Urine NEGATIVE NEGATIVE   Ketones, ur 5 (A) NEGATIVE mg/dL   Protein, ur 30 (A) NEGATIVE mg/dL   Nitrite POSITIVE (A) NEGATIVE   Leukocytes,Ua SMALL (A) NEGATIVE   RBC / HPF 11-20 0 - 5 RBC/hpf   WBC, UA 6-10 0 - 5 WBC/hpf   Bacteria, UA MANY (A) NONE SEEN   Squamous Epithelial / LPF 0-5 0 - 5    Comment: Performed at [facility], [place] [address] [place], [place] [address]  Comprehensive metabolic panel     Status: Abnormal   Collection Time: 10/14/18 11:15 AM  Result Value Ref Range   Sodium 132 (L) 135 - 145 mmol/L  Potassium 3.3 (L) 3.5 - 5.1 mmol/L   Chloride 93 (L) 98 - 111 mmol/L   CO2 23 22 - 32 mmol/L   Glucose, Bld 224 (H) 70 - 99 mg/dL   BUN 45 (H) 8 - 23 mg/dL   Creatinine, Ser 8.111.92 (H) 0.44 - 1.00 mg/dL   Calcium 8.8 (L) 8.9 - 10.3 mg/dL   Total Protein 8.4 (H) 6.5 - 8.1 g/dL   Albumin 3.6 3.5 - 5.0 g/dL   AST 29 15 - 41 U/L   ALT 17 0 - 44 U/L   Alkaline Phosphatase 99 38 - 126 U/L   Total Bilirubin 1.8 (H) 0.3 - 1.2 mg/dL   GFR calc non Af Amer 22 (L) >60 mL/min   GFR calc Af Amer 26 (L) >60 mL/min   Anion gap 16 (H) 5 - 15    Comment: Performed at [facility], [address] [place], [place] [address]  CBC with Differential     Status: Abnormal   Collection Time:  10/14/18 11:15 AM  Result Value Ref Range   WBC 18.0 (H) 4.0 - 10.5 K/uL   RBC 3.63 (L) 3.87 - 5.11 MIL/uL   Hemoglobin 11.1 (L) 12.0 - 15.0 g/dL   HCT 29.534.4 (L) 62.136.0 - 30.846.0 %   MCV 94.8 80.0 - 100.0 fL   MCH 30.6 26.0 - 34.0 pg   MCHC 32.3 30.0 - 36.0 g/dL   RDW 65.713.2 84.611.5 - 96.215.5 %   Platelets 150 150 - 400 K/uL   nRBC 0.0 0.0 - 0.2 %   Neutrophils Relative % 88 %   Neutro Abs 15.7 (H) 1.7 - 7.7 K/uL   Lymphocytes Relative 2 %   Lymphs Abs 0.4 (L) 0.7 - 4.0 K/uL   Monocytes Relative 4 %   Monocytes Absolute 0.7 0.1 - 1.0 K/uL   Eosinophils Relative 1 %   Eosinophils Absolute 0.2 0.0 - 0.5 K/uL   Basophils Relative 0 %   Basophils Absolute 0.0 0.0 - 0.1 K/uL   Immature Granulocytes 5 %   Abs Immature Granulocytes 0.97 (H) 0.00 - 0.07 K/uL    Comment: Performed at [facility], [address] [place], [place] [address]  Protime-INR     Status: Abnormal   Collection Time: 10/14/18 11:15 AM  Result Value Ref Range   Prothrombin Time 17.5 (H) 11.4 - 15.2 seconds   INR 1.5 (H) 0.8 - 1.2    Comment: (NOTE) INR goal varies based on device and disease states. Performed at [facility], [address] [place], [place] [address]   Culture, blood (Routine x 2)     Status: None (Preliminary result)   Collection Time: 10/14/18 11:16 AM   Specimen: BLOOD  Result Value Ref Range   Specimen Description      BLOOD RIGHT ANTECUBITAL Performed at [facility], [address] [place], [place] [address]    Special Requests      BOTTLES DRAWN AEROBIC AND ANAEROBIC Blood Culture adequate volume Performed at [facility], [address] [place], [place] [address]    Culture      NO GROWTH < 12 HOURS Performed at [facility], [address] [place], [place] [address]    Report Status PENDING   SARS Coronavirus 2 (CEPHEID - Performed in [facility] hospital lab), Hosp Order     Status: None   Collection  Time: 10/14/18 11:26 AM   Specimen: Nasopharyngeal  Swab  Result Value Ref Range   SARS Coronavirus 2 NEGATIVE NEGATIVE    Comment: (NOTE) If result is NEGATIVE SARS-CoV-2 target nucleic acids are NOT DETECTED. The SARS-CoV-2 RNA is generally detectable in upper and lower  respiratory specimens during the acute phase of infection. The lowest  concentration of SARS-CoV-2 viral copies this assay can detect is 250  copies / mL. A negative result does not preclude SARS-CoV-2 infection  and should not be used as the sole basis for treatment or other  patient management decisions.  A negative result may occur with  improper specimen collection / handling, submission of specimen other  than nasopharyngeal swab, presence of viral mutation(s) within the  areas targeted by this assay, and inadequate number of viral copies  (<250 copies / mL). A negative result must be combined with clinical  observations, patient history, and epidemiological information. If result is POSITIVE SARS-CoV-2 target nucleic acids are DETECTED. The SARS-CoV-2 RNA is generally detectable in upper and lower  respiratory specimens dur ing the acute phase of infection.  Positive  results are indicative of active infection with SARS-CoV-2.  Clinical  correlation with patient history and other diagnostic information is  necessary to determine patient infection status.  Positive results do  not rule out bacterial infection or co-infection with other viruses. If result is PRESUMPTIVE POSTIVE SARS-CoV-2 nucleic acids MAY BE PRESENT.   A presumptive positive result was obtained on the submitted specimen  and confirmed on repeat testing.  While 2019 novel coronavirus  (SARS-CoV-2) nucleic acids may be present in the submitted sample  additional confirmatory testing may be necessary for epidemiological  and / or clinical management purposes  to differentiate between  SARS-CoV-2 and other Sarbecovirus currently known to infect  humans.  If clinically indicated additional testing with an alternate test  methodology [phone]([id number]) is advised. The SARS-CoV-2 RNA is generally  detectable in upper and lower respiratory sp ecimens during the acute  phase of infection. The expected result is Negative. Fact Sheet for Patients:  BoilerBrush.com.cyhttps://www.fda.gov/media/136312/download Fact Sheet for Healthcare Providers: https://pope.com/https://www.fda.gov/media/136313/download This test is not yet approved or cleared by the [place] FDA and has been authorized for detection and/or diagnosis of SARS-CoV-2 by FDA under an Emergency Use Authorization (EUA).  This EUA will remain in effect (meaning this test can be used) for the duration of the COVID-19 declaration under Section 564(b)(1) of the Act, 21 U.S.C. section 360bbb-3(b)(1), unless the authorization is terminated or revoked sooner. Performed at [facility], [address] [place], [place] [address]   Lactic acid, plasma     Status: Abnormal   Collection Time: 10/14/18  1:11 PM  Result Value Ref Range   Lactic Acid, Venous 2.2 (HH) 0.5 - 1.9 mmol/L    Comment: CRITICAL RESULT CALLED TO, READ BACK BY AND VERIFIED WITH: Johnston EbbsFRANKLIN,C. RN @1613  ON 06.22.2020 BY Carlean PurlOHEN,K Performed at [facility], [address] [place], [place] [address]   CBG monitoring, ED     Status: Abnormal   Collection Time: 10/14/18  4:56 PM  Result Value Ref Range   Glucose-Capillary 223 (H) 70 - 99 mg/dL   Dg Chest 2 View  Result Date: 10/14/2018 CLINICAL DATA:  Altered mental status, sepsis. EXAM: CHEST - 2 VIEW COMPARISON:  None. FINDINGS: The heart size and mediastinal contours are within normal limits. Both lungs are clear. The visualized skeletal structures are unremarkable. IMPRESSION: No active cardiopulmonary disease. Electronically Signed   By: Zenda AlpersJames  Green Jr M.D.  On: 10/14/2018 11:59    Pending Labs Unresulted Labs (From admission, onward)    Start     Ordered    10/15/18 0500  Basic metabolic panel  Tomorrow morning,   R     10/14/18 1524   10/15/18 0500  CBC  Tomorrow morning,   R     10/14/18 1524   10/15/18 0500  Lactic acid, plasma  Tomorrow morning,   R     10/14/18 1525   10/14/18 1126  Troponin I - ONCE - STAT  ONCE - STAT,   STAT     10/14/18 1125   10/14/18 1126  Urine culture  ONCE - STAT,   STAT     10/14/18 1125   10/14/18 1111  Culture, blood (Routine x 2)  BLOOD CULTURE X 2,   STAT     10/14/18 1110          Vitals/Pain Today's Vitals   10/14/18 1657 10/14/18 1700 10/14/18 1730 10/14/18 1808  BP: (!) 111/59 (!) 111/57 (!) 112/58 (!) 124/57  Pulse: 87 86 80 83  Resp: 13 (!) 24 (!) 21 (!) 26  Temp: 98.5 F (36.9 C)     TempSrc: Oral     SpO2: 100% 99% 100% 95%  Weight:      Height:        Isolation Precautions No active isolations  Medications Medications  0.9 %  sodium chloride infusion (1,000 mLs Intravenous New Bag/Given 10/14/18 1127)  cefTRIAXone (ROCEPHIN) 1 g in sodium chloride 0.9 % 100 mL IVPB (has no administration in time range)  insulin aspart (novoLOG) injection 0-9 Units (has no administration in time range)  insulin aspart (novoLOG) injection 0-5 Units (has no administration in time range)  acetaminophen (TYLENOL) tablet 500 mg (has no administration in time range)  sodium chloride flush (NS) 0.9 % injection 3 mL (3 mLs Intravenous Given 10/14/18 1123)  ceFEPIme (MAXIPIME) 2 g in sodium chloride 0.9 % 100 mL IVPB (0 g Intravenous Stopped 10/14/18 1241)  metroNIDAZOLE (FLAGYL) IVPB 500 mg (0 mg Intravenous Stopped 10/14/18 1426)  vancomycin (VANCOCIN) IVPB 1000 mg/200 mL premix (0 mg Intravenous Stopped 10/14/18 1548)  acetaminophen (TYLENOL) suppository 650 mg (650 mg Rectal Given 10/14/18 1157)  potassium chloride SA (K-DUR) CR tablet 40 mEq (40 mEq Oral Given 10/14/18 1811)    Mobility non-ambulatory High fall risk   Focused Assessments Neuro Assessment Handoff:  Swallow screen pass? Yes           Neuro Assessment: Exceptions to WDL Neuro Checks:      Last Documented NIHSS Modified Score:   Has TPA been given? No If patient is a Neuro Trauma and patient is going to OR before floor call report to 4N Charge nurse: [phone] or [phone]     R Recommendations: See Admitting Provider Note  Report given to:   Additional Notes:

## 2018-10-14 NOTE — Progress Notes (Signed)
Report was given to [facility] on 4E. Patient's daughter Freda Munro was notified of patient's transfer and given patient's new room number and RNs number.

## 2018-10-14 NOTE — ED Notes (Signed)
Bed: WA17 Expected date:  Expected time:  Means of arrival:  Comments: EMS UTI/confusion

## 2018-10-14 NOTE — ED Provider Notes (Signed)
[place] DEPT Provider Note   CSN: [id number] Arrival date & time: 10/14/18  1019     History   Chief Complaint Chief Complaint  Patient presents with  . Altered Mental Status    HPI Chloe Bradley is a 85 y.o. female.     The history is provided by the patient, the EMS personnel and a relative. The history is limited by the condition of the patient (Confusion).  Altered Mental Status Pt was seen at 1120. Per EMS, and pt's family:  Pt's family states pt has had increasing confusion for the past 3 weeks, which as worsened over the past 2 days. Has been associated with generalized weakness and fevers. Pt's family concerned pt has a UTI. Pt herself denies any complaints and does not know why she was brought to the ED today. Denies CP/SOB, no cough, no abd pain, no vomiting/diarrhea, no focal motor weakness.    No past medical history on file.  There are no active problems to display for this patient.      OB History   No obstetric history on file.      Home Medications    Prior to Admission medications   Medication Sig Start Date End Date Taking? Authorizing Provider  EDARBYCLOR 40-12.5 MG TABS Take 1 tablet by mouth every evening.  09/23/18  Yes [provider]  felodipine (PLENDIL) 10 MG 24 hr tablet Take 10 mg by mouth every evening.  08/20/18  Yes [provider]  TRADJENTA 5 MG TABS tablet Take 5 mg by mouth every evening.  09/09/18  Yes [provider]    Family History No family history on file.  Social History Social History   Tobacco Use  . Smoking status: Not on file  Substance Use Topics  . Alcohol use: Not on file  . Drug use: Not on file     Allergies   Patient has no known allergies.   Review of Systems Review of Systems  Unable to perform ROS: Mental status change     Physical Exam Updated Vital Signs BP 137/61   Pulse (!) 108   Temp (!) 103.7 F (39.8 C) (Rectal)   Resp  (!) 34   Ht 5\' 5"  (1.651 m)   Wt 74.8 kg   SpO2 96%   BMI 27.46 kg/m   Patient Vitals for the past 24 hrs:  BP Temp Temp src Pulse Resp SpO2 Height Weight  10/14/18 1239 137/61 - - (!) 108 (!) 34 96 % - -  10/14/18 1203 - - - - - - 5\' 5"  (1.651 m) 74.8 kg  10/14/18 1150 (!) 142/60 - - (!) 105 (!) 25 94 % - -  10/14/18 1100 - - - - - 100 % - -  10/14/18 1058 (!) 142/67 (!) 103.7 F (39.8 C) Rectal (!) 103 (!) 31 100 % - -    Physical Exam 1125: Physical examination:  Nursing notes reviewed; Vital signs and O2 SAT reviewed;  Constitutional: Well developed, Well nourished, In no acute distress; Head:  Normocephalic, atraumatic; Eyes: EOMI, PERRL, No scleral icterus; ENMT: Mouth and pharynx normal, Mucous membranes dry; Neck: Supple, Full range of motion, No lymphadenopathy; Cardiovascular: Regular rate and rhythm, No gallop; Respiratory: Breath sounds clear & equal bilaterally, No wheezes.  Speaking full sentences with ease, Normal respiratory effort/excursion; Chest: Nontender, Movement normal; Abdomen: Soft, Nontender, Nondistended, Normal bowel sounds; Genitourinary: No CVA tenderness; Extremities: Peripheral pulses normal, No tenderness, +2 pedal edema  bilat with chronic stasis changes. No calf asymmetry.; Neuro: Awake/alert, confused re: time, events. No facial droop.  Speech clear. Grips equal. Strength 5/5 equal bilat UE's and LE's. Pt moves all extremities spontaneously and to command without apparent gross focal motor deficits in extremities.; Skin: Color normal, Warm, Dry.     ED Treatments / Results  Labs (all labs ordered are listed, but only abnormal results are displayed)   EKG EKG Interpretation  Date/Time:  Monday October 14 2018 10:55:35 EDT Ventricular Rate:  105 PR Interval:    QRS Duration: 101 QT Interval:  312 QTC Calculation: 413 R Axis:   -22 Text Interpretation:  Sinus tachycardia Prolonged PR interval Borderline left axis deviation Abnormal R-wave  progression, early transition Nonspecific T abnormalities, lateral leads No old tracing to compare Confirmed by Samuel JesterMcManus, Rita Vialpando [phone]([id number]) on 10/14/2018 11:35:58 AM   Radiology   Procedures Procedures (including critical care time)  Medications Ordered in ED Medications  0.9 %  sodium chloride infusion (1,000 mLs Intravenous New Bag/Given 10/14/18 1127)  metroNIDAZOLE (FLAGYL) IVPB 500 mg (500 mg Intravenous New Bag/Given 10/14/18 1241)  vancomycin (VANCOCIN) IVPB 1000 mg/200 mL premix (has no administration in time range)  sodium chloride flush (NS) 0.9 % injection 3 mL (3 mLs Intravenous Given 10/14/18 1123)  ceFEPIme (MAXIPIME) 2 g in sodium chloride 0.9 % 100 mL IVPB (0 g Intravenous Stopped 10/14/18 1241)  acetaminophen (TYLENOL) suppository 650 mg (650 mg Rectal Given 10/14/18 1157)     Initial Impression / Assessment and Plan / ED Course  I have reviewed the triage vital signs and the nursing notes.  Pertinent labs & imaging results that were available during my care of the patient were reviewed by me and considered in my medical decision making (see chart for details).     MDM Reviewed: previous chart, nursing note and vitals Reviewed previous: labs and ECG Interpretation: labs, ECG and x-ray Total time providing critical care: 30-74 minutes. This excludes time spent performing separately reportable procedures and services. Consults: admitting MD   CRITICAL CARE Performed by: Samuel JesterKathleen Elizah Mierzwa Total critical care time: 35 minutes Critical care time was exclusive of separately billable procedures and treating other patients. Critical care was necessary to treat or prevent imminent or life-threatening deterioration. Critical care was time spent personally by me on the following activities: development of treatment plan with patient and/or surrogate as well as nursing, discussions with consultants, evaluation of patient's response to treatment, examination of patient, obtaining  history from patient or surrogate, ordering and performing treatments and interventions, ordering and review of laboratory studies, ordering and review of radiographic studies, pulse oximetry and re-evaluation of patient's condition.   Results for orders placed or performed during the hospital encounter of 10/14/18  Culture, blood (Routine x 2)   Specimen: BLOOD  Result Value Ref Range   Specimen Description      BLOOD RIGHT ANTECUBITAL Performed at [facility], [address] [place], [place] [address]    Special Requests      BOTTLES DRAWN AEROBIC AND ANAEROBIC Blood Culture adequate volume Performed at [facility], [address] [place], [place] [address]    Culture      NO GROWTH < 12 HOURS Performed at [facility], [address] [place], [place] [address]    Report Status PENDING   SARS Coronavirus 2 (CEPHEID - Performed in [facility] hospital lab), [facility]   Specimen: Nasopharyngeal Swab  Result Value Ref Range  SARS Coronavirus 2 NEGATIVE NEGATIVE  Comprehensive metabolic panel  Result Value Ref Range   Sodium 132 (L) 135 - 145 mmol/L   Potassium 3.3 (L) 3.5 - 5.1 mmol/L   Chloride 93 (L) 98 - 111 mmol/L   CO2 23 22 - 32 mmol/L   Glucose, Bld 224 (H) 70 - 99 mg/dL   BUN 45 (H) 8 - 23 mg/dL   Creatinine, Ser 1.611.92 (H) 0.44 - 1.00 mg/dL   Calcium 8.8 (L) 8.9 - 10.3 mg/dL   Total Protein 8.4 (H) 6.5 - 8.1 g/dL   Albumin 3.6 3.5 - 5.0 g/dL   AST 29 15 - 41 U/L   ALT 17 0 - 44 U/L   Alkaline Phosphatase 99 38 - 126 U/L   Total Bilirubin 1.8 (H) 0.3 - 1.2 mg/dL   GFR calc non Af Amer 22 (L) >60 mL/min   GFR calc Af Amer 26 (L) >60 mL/min   Anion gap 16 (H) 5 - 15  Lactic acid, plasma  Result Value Ref Range   Lactic Acid, Venous 2.5 (HH) 0.5 - 1.9 mmol/L  CBC with Differential  Result Value Ref Range   WBC 18.0 (H) 4.0 - 10.5 K/uL   RBC 3.63 (L) 3.87 - 5.11 MIL/uL   Hemoglobin 11.1 (L) 12.0 - 15.0 g/dL   HCT  09.634.4 (L) 04.536.0 - 46.0 %   MCV 94.8 80.0 - 100.0 fL   MCH 30.6 26.0 - 34.0 pg   MCHC 32.3 30.0 - 36.0 g/dL   RDW 40.913.2 81.111.5 - 91.415.5 %   Platelets 150 150 - 400 K/uL   nRBC 0.0 0.0 - 0.2 %   Neutrophils Relative % 88 %   Neutro Abs 15.7 (H) 1.7 - 7.7 K/uL   Lymphocytes Relative 2 %   Lymphs Abs 0.4 (L) 0.7 - 4.0 K/uL   Monocytes Relative 4 %   Monocytes Absolute 0.7 0.1 - 1.0 K/uL   Eosinophils Relative 1 %   Eosinophils Absolute 0.2 0.0 - 0.5 K/uL   Basophils Relative 0 %   Basophils Absolute 0.0 0.0 - 0.1 K/uL   Immature Granulocytes 5 %   Abs Immature Granulocytes 0.97 (H) 0.00 - 0.07 K/uL  Protime-INR  Result Value Ref Range   Prothrombin Time 17.5 (H) 11.4 - 15.2 seconds   INR 1.5 (H) 0.8 - 1.2  Urinalysis, Routine w reflex microscopic  Result Value Ref Range   Color, Urine YELLOW YELLOW   APPearance CLEAR CLEAR   Specific Gravity, Urine 1.008 1.005 - 1.030   pH 6.0 5.0 - 8.0   Glucose, UA >=500 (A) NEGATIVE mg/dL   Hgb urine dipstick MODERATE (A) NEGATIVE   Bilirubin Urine NEGATIVE NEGATIVE   Ketones, ur 5 (A) NEGATIVE mg/dL   Protein, ur 30 (A) NEGATIVE mg/dL   Nitrite POSITIVE (A) NEGATIVE   Leukocytes,Ua SMALL (A) NEGATIVE   RBC / HPF 11-20 0 - 5 RBC/hpf   WBC, UA 6-10 0 - 5 WBC/hpf   Bacteria, UA MANY (A) NONE SEEN   Squamous Epithelial / LPF 0-5 0 - 5  CBG monitoring, ED  Result Value Ref Range   Glucose-Capillary 183 (H) 70 - 99 mg/dL   Dg Chest 2 View Result Date: 10/14/2018 CLINICAL DATA:  Altered mental status, sepsis. EXAM: CHEST - 2 VIEW COMPARISON:  None. FINDINGS: The heart size and mediastinal contours are within normal limits. Both lungs are clear. The visualized skeletal structures are unremarkable. IMPRESSION: No active cardiopulmonary disease. Electronically Signed  By: Lupita RaiderJames  Green Jr M.D.   On: 10/14/2018 11:59   Chloe Bradley was evaluated in Emergency Department on 10/14/2018 for the symptoms described in the history of present illness. She was  evaluated in the context of the global COVID-19 pandemic, which necessitated consideration that the patient might be at risk for infection with the SARS-CoV-2 virus that causes COVID-19. Institutional protocols and algorithms that pertain to the evaluation of patients at risk for COVID-19 are in a state of rapid change based on information released by regulatory bodies including the CDC and federal and state organizations. These policies and algorithms were followed during the patient's care in the ED.    1525:  Code Sepsis called on pt's arrival. IV abx given after [facility] and UC obtained. APAP given for fever. BP stable. Will need admit.  T/C returned from [facility]. Renford DillsAdhikari, case discussed, including:  HPI, pertinent PM/SHx, VS/PE, dx testing, ED course and treatment:  Agreeable to admit.     Final Clinical Impressions(s) / ED Diagnoses   Final diagnoses:  None    ED Discharge Orders    None       Samuel JesterMcManus, Kameko Hukill, DO 10/17/18 1110

## 2018-10-14 NOTE — Progress Notes (Signed)
A consult was received from an ED physician for vancomycin and cefepime per pharmacy dosing.  The patient's profile has been reviewed for ht/wt/allergies/indication/available labs.    A one time order has been placed for vancomycin 1000 mg and cefepime 2gm IV.  Further antibiotics/pharmacy consults should be ordered by admitting physician if indicated.                       Thank you, Lynelle Doctor 10/14/2018  11:45 AM

## 2018-10-14 NOTE — ED Notes (Signed)
CRITICAL VALUE STICKER  CRITICAL VALUE: Lactic Acid: 2.5  DATE & TIME NOTIFIED: 10/14/18 1154  MD NOTIFIED: Thurnell Garbe MD  TIME OF NOTIFICATION: 0737

## 2018-10-14 NOTE — Progress Notes (Signed)
CRITICAL VALUE ALERT  Critical Value:  Troponin 0.06  Date & Time Notied: 10/14/2018 @2120   Provider Notified:J Maudie Mercury  Orders Received/Actions taken: Waiting on orders

## 2018-10-14 NOTE — ED Notes (Signed)
Family called verbalizing family needs to be at bedside. This writer verbalized to family that we are not allowing visitors but could face time or call to involve family in patient care decisions.   Contact number [phone]  Primary RN and provider notified family requesting information regarding pt care plan.

## 2018-10-14 NOTE — ED Notes (Signed)
Patient transported to X-ray 

## 2018-10-14 NOTE — H&P (Signed)
History and Physical    Chloe Bradley WUJ:[id number] DOB: 01/27/1925 DOA: 10/14/2018  PCP: Mirna MiresHill, Gerald, MD   Patient coming from: Home  Chief Complaint: Fever  HPI: Chloe CheadleOuida R Younes is a 85 y.o. female with medical history significant of hypertension, diabetes who was brought from home from her family for weakness,confusion.  Patient does not know why she was brought to the emergency department.  She is able to communicate and is alert and answers to questions.  She denies any complaints to me. Information was taken from her daughter.  As per the family patient has increasing confusion for last 3 weeks.  It actually worsened for the last 2 days.  She thinks her mother has gone Downhill rapidly.  She was also very weak.  She was not eating or drinking and not taking shower.  On her usual days, she is alert but had issues with short-term memory but is usually functional.  She can walk without problem.  Daughter told me that she has been sleepy since last few days and confused.  She had a history of urinary tract infection in the past. Family were very concerned that patient might have urinary tract infection and brought to the emergency department. Patient seen and examined the bedside in the emergency department.  Looks comfortable during my evaluation.  Her blood pressure stable.  She answered most of the questions to me.  She denied any cough, shortness of breath, chest pain, abdomen pain, nausea, vomiting or diarrhea. She does not smoke currently.  No current alcohol use.  ED Course: Found to have fever of 103 Fahrenheit on presentation.  Chest x-ray did not show pneumonia.  UA suspicion for urinary tract infection but not reassuring.  Patient was given broad start antibiotics by ED.  Started on IV fluids for acute kidney injury.  She also had elevated lactic acid level, hypokalemia on presentation.  Leukocytosis of 18.  Review of Systems: As per HPI otherwise 10 point review of systems  negative.     Prior to Admission medications   Medication Sig Start Date End Date Taking? Authorizing Provider  EDARBYCLOR 40-12.5 MG TABS Take 1 tablet by mouth every evening.  09/23/18  Yes [provider]  felodipine (PLENDIL) 10 MG 24 hr tablet Take 10 mg by mouth every evening.  08/20/18  Yes [provider]  TRADJENTA 5 MG TABS tablet Take 5 mg by mouth every evening.  09/09/18  Yes [provider]    Physical Exam: Vitals:   10/14/18 1239 10/14/18 1344 10/14/18 1430 10/14/18 1500  BP: 137/61 130/62 (!) 116/55 113/63  Pulse: (!) 108 (!) 101 97 91  Resp: (!) 34 (!) 23 (!) 24 (!) 29  Temp:      TempSrc:      SpO2: 96% 99% 96% 100%  Weight:      Height:        Constitutional:Not in distress, calm, comfortable Vitals:   10/14/18 1239 10/14/18 1344 10/14/18 1430 10/14/18 1500  BP: 137/61 130/62 (!) 116/55 113/63  Pulse: (!) 108 (!) 101 97 91  Resp: (!) 34 (!) 23 (!) 24 (!) 29  Temp:      TempSrc:      SpO2: 96% 99% 96% 100%  Weight:      Height:       Eyes: PERRL, lids and conjunctivae normal ENMT: Mucous membranes are dry.  Neck: normal, supple, no masses, no thyromegaly Respiratory: clear to auscultation bilaterally, no wheezing, no crackles. Normal  respiratory effort. No accessory muscle use.  Cardiovascular: Regular rate and rhythm, no murmurs / rubs / gallops. Severe bilateral lower  extremity edema. 2+ pedal pulses. No carotid bruits.  Abdomen: no tenderness, no masses palpated. No hepatosplenomegaly. Bowel sounds positive.  Musculoskeletal: no clubbing / cyanosis. No joint deformity upper and lower extremities. Good ROM, no contractures. Normal muscle tone.  Skin: no rashes, lesions, ulcers. No induration Neurologic: CN 2-12 grossly intact. Sensation intact,Strength 5/5 in all 4.  Psychiatric:Alert and awake.Not oriented to time.  Oriented to place and person  Foley Catheter:None  Labs on Admission: I have personally reviewed following  labs and imaging studies  CBC: Recent Labs  Lab 10/14/18 1115  WBC 18.0*  NEUTROABS 15.7*  HGB 11.1*  HCT 34.4*  MCV 94.8  PLT 150   Basic Metabolic Panel: Recent Labs  Lab 10/14/18 1115  NA 132*  K 3.3*  CL 93*  CO2 23  GLUCOSE 224*  BUN 45*  CREATININE 1.92*  CALCIUM 8.8*   GFR: Estimated Creatinine Clearance: 19.3 mL/min (A) (by C-G formula based on SCr of 1.92 mg/dL (H)). Liver Function Tests: Recent Labs  Lab 10/14/18 1115  AST 29  ALT 17  ALKPHOS 99  BILITOT 1.8*  PROT 8.4*  ALBUMIN 3.6   No results for input(s): LIPASE, AMYLASE in the last 168 hours. No results for input(s): AMMONIA in the last 168 hours. Coagulation Profile: Recent Labs  Lab 10/14/18 1115  INR 1.5*   Cardiac Enzymes: No results for input(s): CKTOTAL, CKMB, CKMBINDEX, TROPONINI in the last 168 hours. BNP (last 3 results) No results for input(s): PROBNP in the last 8760 hours. HbA1C: No results for input(s): HGBA1C in the last 72 hours. CBG: Recent Labs  Lab 10/14/18 1054  GLUCAP 183*   Lipid Profile: No results for input(s): CHOL, HDL, LDLCALC, TRIG, CHOLHDL, LDLDIRECT in the last 72 hours. Thyroid Function Tests: No results for input(s): TSH, T4TOTAL, FREET4, T3FREE, THYROIDAB in the last 72 hours. Anemia Panel: No results for input(s): VITAMINB12, FOLATE, FERRITIN, TIBC, IRON, RETICCTPCT in the last 72 hours. Urine analysis:    Component Value Date/Time   COLORURINE YELLOW 10/14/2018 1111   APPEARANCEUR CLEAR 10/14/2018 1111   LABSPEC 1.008 10/14/2018 1111   PHURINE 6.0 10/14/2018 1111   GLUCOSEU >=500 (A) 10/14/2018 1111   HGBUR MODERATE (A) 10/14/2018 1111   BILIRUBINUR NEGATIVE 10/14/2018 1111   KETONESUR 5 (A) 10/14/2018 1111   PROTEINUR 30 (A) 10/14/2018 1111   NITRITE POSITIVE (A) 10/14/2018 1111   LEUKOCYTESUR SMALL (A) 10/14/2018 1111    Radiological Exams on Admission: Dg Chest 2 View  Result Date: 10/14/2018 CLINICAL DATA:  Altered mental status,  sepsis. EXAM: CHEST - 2 VIEW COMPARISON:  None. FINDINGS: The heart size and mediastinal contours are within normal limits. Both lungs are clear. The visualized skeletal structures are unremarkable. IMPRESSION: No active cardiopulmonary disease. Electronically Signed   By: Lupita RaiderJames  Green Jr M.D.   On: 10/14/2018 11:59     Assessment/Plan Principal Problem:   Sepsis secondary to UTI [facility]HCC) Active Problems:   AKI (acute kidney injury) (HCC)   Generalized weakness   HTN (hypertension)   DM (diabetes mellitus) (HCC)   UTI (urinary tract infection)     Suspected sepsis: Presented with fever of 103 F.  Also had lactic acidosis, leukocytosis.  Most likely source is urine.  Chest x-ray was clear.  Abdomen nontender .Cultures have been sent.  Continue antibiotics.  Urinary tract infection: This is the most likely  source of sepsis.  She has UTIs in the past.  Follow-up urine culture.  Started on ceftriaxone  Acute kidney injury: As per the daughter she does not have any problems with her renal function.  No previous lab work found.  Patient looks significantly dehydrated during my evaluation.  Continue IV fluids.  Check BMP tomorrow.  Lactic acidosis: Lactic acid of 2.5 on presentation.  Continue IV fluids  Hypertension: Currently blood pressure stable she is on antihypertensives at home.  Antihypertensives currently on hold.  Diabetes type 2: On tradjenta  at home.  Continue sliding scale insulin here.  Hyperglycemia on presentation.  Bilateral lower extremity swelling: As per the daughter, this is a chronic problem.  She looks intravascularly depleted .  Continue IV fluids.  Elevate the legs, TED hose for lower extremity edema.  Debility/deconditioning/generalized weakness: We will request for physical therapy evaluation.    Severity of Illness: The appropriate patient status for this patient is OBSERVATION.     DVT prophylaxis: SCD Code Status: Full (verified with daughter) Family  Communication: Discussed with daughter on phone Consults called: None     Shelly Coss MD [facility] Pager [phone]  If 7PM-7AM, please contact night-coverage [email or web address] Password [facility]  10/14/2018, 3:25 PM

## 2018-10-15 ENCOUNTER — Encounter (HOSPITAL_COMMUNITY): Payer: Self-pay

## 2018-10-15 ENCOUNTER — Observation Stay (HOSPITAL_COMMUNITY): Payer: Medicare Other

## 2018-10-15 DIAGNOSIS — N179 Acute kidney failure, unspecified: Secondary | ICD-10-CM | POA: Diagnosis present

## 2018-10-15 DIAGNOSIS — N390 Urinary tract infection, site not specified: Secondary | ICD-10-CM | POA: Diagnosis not present

## 2018-10-15 DIAGNOSIS — I361 Nonrheumatic tricuspid (valve) insufficiency: Secondary | ICD-10-CM | POA: Diagnosis not present

## 2018-10-15 DIAGNOSIS — B9620 Unspecified Escherichia coli [E. coli] as the cause of diseases classified elsewhere: Secondary | ICD-10-CM | POA: Diagnosis present

## 2018-10-15 DIAGNOSIS — E872 Acidosis: Secondary | ICD-10-CM | POA: Diagnosis present

## 2018-10-15 DIAGNOSIS — E1165 Type 2 diabetes mellitus with hyperglycemia: Secondary | ICD-10-CM | POA: Diagnosis present

## 2018-10-15 DIAGNOSIS — R4182 Altered mental status, unspecified: Secondary | ICD-10-CM | POA: Diagnosis present

## 2018-10-15 DIAGNOSIS — E860 Dehydration: Secondary | ICD-10-CM | POA: Diagnosis present

## 2018-10-15 DIAGNOSIS — Z1159 Encounter for screening for other viral diseases: Secondary | ICD-10-CM | POA: Diagnosis not present

## 2018-10-15 DIAGNOSIS — Z79899 Other long term (current) drug therapy: Secondary | ICD-10-CM | POA: Diagnosis not present

## 2018-10-15 DIAGNOSIS — A4151 Sepsis due to Escherichia coli [E. coli]: Secondary | ICD-10-CM | POA: Diagnosis present

## 2018-10-15 DIAGNOSIS — Z87440 Personal history of urinary (tract) infections: Secondary | ICD-10-CM | POA: Diagnosis not present

## 2018-10-15 DIAGNOSIS — L8990 Pressure ulcer of unspecified site, unspecified stage: Secondary | ICD-10-CM | POA: Insufficient documentation

## 2018-10-15 DIAGNOSIS — A419 Sepsis, unspecified organism: Secondary | ICD-10-CM | POA: Diagnosis not present

## 2018-10-15 DIAGNOSIS — E876 Hypokalemia: Secondary | ICD-10-CM | POA: Diagnosis present

## 2018-10-15 DIAGNOSIS — I10 Essential (primary) hypertension: Secondary | ICD-10-CM | POA: Diagnosis present

## 2018-10-15 DIAGNOSIS — M7989 Other specified soft tissue disorders: Secondary | ICD-10-CM | POA: Diagnosis present

## 2018-10-15 DIAGNOSIS — L89151 Pressure ulcer of sacral region, stage 1: Secondary | ICD-10-CM | POA: Diagnosis present

## 2018-10-15 LAB — BASIC METABOLIC PANEL
Anion gap: 14 (ref 5–15)
BUN: 38 mg/dL — ABNORMAL HIGH (ref 8–23)
CO2: 21 mmol/L — ABNORMAL LOW (ref 22–32)
Calcium: 8.6 mg/dL — ABNORMAL LOW (ref 8.9–10.3)
Chloride: 104 mmol/L (ref 98–111)
Creatinine, Ser: 1.46 mg/dL — ABNORMAL HIGH (ref 0.44–1.00)
GFR calc Af Amer: 36 mL/min — ABNORMAL LOW (ref >60)
GFR calc non Af Amer: 31 mL/min — ABNORMAL LOW (ref >60)
Glucose, Bld: 188 mg/dL — ABNORMAL HIGH (ref 70–99)
Potassium: 3 mmol/L — ABNORMAL LOW (ref 3.5–5.1)
Sodium: 139 mmol/L (ref 135–145)

## 2018-10-15 LAB — CBC
HCT: 32.7 % — ABNORMAL LOW (ref 36.0–46.0)
Hemoglobin: 10.5 g/dL — ABNORMAL LOW (ref 12.0–15.0)
MCH: 30.2 pg (ref 26.0–34.0)
MCHC: 32.1 g/dL (ref 30.0–36.0)
MCV: 94 fL (ref 80.0–100.0)
Platelets: 122 10*3/uL — ABNORMAL LOW (ref 150–400)
RBC: 3.48 MIL/uL — ABNORMAL LOW (ref 3.87–5.11)
RDW: 13.4 % (ref 11.5–15.5)
WBC: 20 10*3/uL — ABNORMAL HIGH (ref 4.0–10.5)
nRBC: 0 % (ref 0.0–0.2)

## 2018-10-15 LAB — URINE CULTURE: Culture: <10000 — AB

## 2018-10-15 LAB — BLOOD CULTURE ID PANEL (REFLEXED)

## 2018-10-15 LAB — TROPONIN I
Troponin I: 0.05 ng/mL (ref <0.03)
Troponin I: 0.04 ng/mL (ref <0.03)

## 2018-10-15 LAB — LACTIC ACID, PLASMA: Lactic Acid, Venous: 1.5 mmol/L (ref 0.5–1.9)

## 2018-10-15 LAB — TSH: TSH: <0.01 u[IU]/mL — ABNORMAL LOW (ref 0.350–4.500)

## 2018-10-15 LAB — ECHOCARDIOGRAM COMPLETE
Height: 65 in
Weight: 2640 oz

## 2018-10-15 LAB — GLUCOSE, CAPILLARY
Glucose-Capillary: 156 mg/dL — ABNORMAL HIGH (ref 70–99)
Glucose-Capillary: 128 mg/dL — ABNORMAL HIGH (ref 70–99)
Glucose-Capillary: 156 mg/dL — ABNORMAL HIGH (ref 70–99)
Glucose-Capillary: 107 mg/dL — ABNORMAL HIGH (ref 70–99)

## 2018-10-15 MED ORDER — GUAIFENESIN-DM 100-10 MG/5ML PO SYRP: 5.0000 mL | ORAL_SOLUTION | ORAL | Status: DC | PRN

## 2018-10-15 MED ORDER — SENNOSIDES-DOCUSATE SODIUM 8.6-50 MG PO TABS: 2.0000 | ORAL_TABLET | Freq: Every evening | ORAL | Status: DC | PRN

## 2018-10-15 MED ORDER — POTASSIUM CHLORIDE CRYS ER 20 MEQ PO TBCR
40.0000 meq | EXTENDED_RELEASE_TABLET | ORAL | Status: AC
  Administered 2018-10-15 (×2): 40 meq via ORAL
  Filled 2018-10-15 (×2): qty 2

## 2018-10-15 MED ORDER — SODIUM CHLORIDE 0.9% FLUSH
10.0000 mL | Freq: Two times a day (BID) | INTRAVENOUS | Status: DC
  Administered 2018-10-15: 10 mL

## 2018-10-15 MED ORDER — ALUM & MAG HYDROXIDE-SIMETH 200-200-20 MG/5ML PO SUSP: 30.0000 mL | ORAL | Status: DC | PRN

## 2018-10-15 MED ORDER — LORATADINE 10 MG PO TABS: 10.0000 mg | ORAL_TABLET | Freq: Every day | ORAL | Status: DC | PRN

## 2018-10-15 MED ORDER — SODIUM CHLORIDE 0.9 % IV SOLN
1000.0000 mL | INTRAVENOUS | Status: DC
  Administered 2018-10-15 – 2018-10-16 (×2): 1000 mL via INTRAVENOUS

## 2018-10-15 MED ORDER — MUSCLE RUB 10-15 % EX CREA
1.0000 "application " | TOPICAL_CREAM | CUTANEOUS | Status: DC | PRN
  Filled 2018-10-15: qty 85

## 2018-10-15 MED ORDER — SODIUM CHLORIDE 0.9 % IV SOLN
2.0000 g | INTRAVENOUS | Status: DC
  Filled 2018-10-15: qty 20

## 2018-10-15 MED ORDER — SODIUM CHLORIDE 0.9% FLUSH: 10.0000 mL | INTRAVENOUS | Status: DC | PRN

## 2018-10-15 MED ORDER — HYDROCORTISONE (PERIANAL) 2.5 % EX CREA
1.0000 "application " | TOPICAL_CREAM | Freq: Four times a day (QID) | CUTANEOUS | Status: DC | PRN
  Filled 2018-10-15: qty 28.35

## 2018-10-15 MED ORDER — LIP MEDEX EX OINT: 1.0000 "application " | TOPICAL_OINTMENT | CUTANEOUS | Status: DC | PRN

## 2018-10-15 MED ORDER — SODIUM CHLORIDE 0.9 % IV SOLN
1.0000 g | INTRAVENOUS | Status: AC
  Administered 2018-10-15: 01:00:00 1 g via INTRAVENOUS
  Filled 2018-10-15: qty 1

## 2018-10-15 MED ORDER — POLYETHYLENE GLYCOL 3350 17 G PO PACK: 17.0000 g | PACK | Freq: Every day | ORAL | Status: DC | PRN

## 2018-10-15 MED ORDER — POLYVINYL ALCOHOL 1.4 % OP SOLN
1.0000 [drp] | OPHTHALMIC | Status: DC | PRN
  Filled 2018-10-15: qty 15

## 2018-10-15 MED ORDER — MAGNESIUM OXIDE 400 (241.3 MG) MG PO TABS
800.0000 mg | ORAL_TABLET | Freq: Once | ORAL | Status: AC
  Administered 2018-10-15: 800 mg via ORAL
  Filled 2018-10-15: qty 2

## 2018-10-15 MED ORDER — SALINE SPRAY 0.65 % NA SOLN
1.0000 | NASAL | Status: DC | PRN
  Filled 2018-10-15: qty 44

## 2018-10-15 MED ORDER — HYDROCORTISONE 1 % EX CREA
1.0000 "application " | TOPICAL_CREAM | Freq: Three times a day (TID) | CUTANEOUS | Status: DC | PRN
  Filled 2018-10-15: qty 28

## 2018-10-15 MED ORDER — PHENOL 1.4 % MT LIQD
1.0000 | OROMUCOSAL | Status: DC | PRN
  Filled 2018-10-15: qty 177

## 2018-10-15 MED ORDER — HYDRALAZINE HCL 20 MG/ML IJ SOLN: 10.0000 mg | INTRAMUSCULAR | Status: DC | PRN

## 2018-10-15 NOTE — Progress Notes (Signed)
  Echocardiogram 2D Echocardiogram has been performed.  Chloe Bradley G Solace Manwarren 10/15/2018, 9:08 AM

## 2018-10-15 NOTE — Progress Notes (Signed)
PROGRESS NOTE    Chloe CheadleOuida R Meriwether  UXL:[id number] DOB: 01/27/1925 DOA: 10/14/2018 PCP: Mirna MiresHill, Gerald, MD   Brief Narrative:  85 year old with history of hypertension, diabetes mellitus type 2 admitted for worsening weakness and confusion.  She was diagnosed with urinary tract infection with bacteremia.  She was also noted to have acute kidney injury requiring IV fluids.   Assessment & Plan:   Principal Problem:   Sepsis secondary to UTI [facility](HCC) Active Problems:   AKI (acute kidney injury) (HCC)   Generalized weakness   HTN (hypertension)   DM (diabetes mellitus) (HCC)   UTI (urinary tract infection)   Pressure injury of skin  Sepsis secondary to urinary tract infection, POA Gram-negative rod bacteremia -Sepsis protocol initiated.  Follow-up culture data, continue IV Rocephin 2 g every 24 hours.  Provide supportive care.  Monitor urine output.  Monitor vital signs. -Lactate trended down with IV fluids -COVID-negative  Acute kidney injury, prerenal Moderate dehydration -Unknown baseline.  Admission creatinine 1.92 which is trended down to 1.46 with IV fluids.  We will continue to give gentle hydration.  Oral diet as tolerated.  Hypokalemia -Repletion has been ordered.  Essential hypertension -Home medications on hold to avoid hypotension.  Diabetes mellitus type 2 -On Tradjenta which is on hold.  Insulin sliding scale and Accu-Chek.  Generalized weakness due to above-mentioned assessments -PT/OT ordered  DVT prophylaxis: SCDs Code Status: Full code Family Communication: Spoke with the patient's daughter over the phone Disposition Plan: Patient will require at least another 24-48 hours of hospital stay for IV antibiotics and IV fluids.  Unsafe for discharge at this time given her sepsis, bacteremia and acute kidney injury.  Consultants:   None  Procedures:  None  Antimicrobials:   Rocephin day 2   Subjective: This morning patient states she may feel slightly  better and is able to tolerate some oral diet.  Low-grade temperature this morning.  Review of Systems Otherwise negative except as per HPI, including: General: Denies fever, chills, night sweats or unintended weight loss. Resp: Denies cough, wheezing, shortness of breath. Cardiac: Denies chest pain, palpitations, orthopnea, paroxysmal nocturnal dyspnea. GI: Denies abdominal pain, nausea, vomiting, diarrhea or constipation GU: Denies dysuria, frequency, hesitancy or incontinence MS: Denies muscle aches, joint pain or swelling Neuro: Denies headache, neurologic deficits (focal weakness, numbness, tingling), abnormal gait Psych: Denies anxiety, depression, SI/HI/AVH Skin: Denies new rashes or lesions ID: Denies sick contacts, exotic exposures, travel  Objective: Vitals:   10/14/18 1808 10/14/18 1947 10/14/18 2218 10/15/18 0604  BP: (!) 124/57 (!) 128/50 129/66 125/80  Pulse: 83 83 85 83  Resp: (!) 26 18 20 16   Temp:  98.6 F (37 C) 98.4 F (36.9 C) 100 F (37.8 C)  TempSrc:  Oral Oral Oral  SpO2: 95% 100% 99% 95%  Weight:      Height:        Intake/Output Summary (Last 24 hours) at 10/15/2018 1033 Last data filed at 10/15/2018 0530 Gross per 24 hour  Intake 2011.21 ml  Output 825 ml  Net 1186.21 ml   Filed Weights   10/14/18 1203  Weight: 74.8 kg    Examination:  General exam: Appears calm and comfortable, elderly frail Respiratory system: Clear to auscultation. Respiratory effort normal. Cardiovascular system: S1 & S2 heard, RRR. No JVD, murmurs, rubs, gallops or clicks. No pedal edema. Gastrointestinal system: Abdomen is nondistended, soft and nontender. No organomegaly or masses felt. Normal bowel sounds heard. Central nervous system: Alert and oriented. No focal neurological  deficits. Extremities: Symmetric 4 x 5 power. Skin: No rashes, lesions or ulcers Psychiatry: Judgement and insight appear normal. Mood & affect appropriate.     Data Reviewed:   CBC:  Recent Labs  Lab 10/14/18 1115 10/15/18 0644  WBC 18.0* 20.0*  NEUTROABS 15.7*  --   HGB 11.1* 10.5*  HCT 34.4* 32.7*  MCV 94.8 94.0  PLT 150 166*   Basic Metabolic Panel: Recent Labs  Lab 10/14/18 1115 10/15/18 0644  NA 132* 139  K 3.3* 3.0*  CL 93* 104  CO2 23 21*  GLUCOSE 224* 188*  BUN 45* 38*  CREATININE 1.92* 1.46*  CALCIUM 8.8* 8.6*   GFR: Estimated Creatinine Clearance: 25.4 mL/min (A) (by C-G formula based on SCr of 1.46 mg/dL (H)). Liver Function Tests: Recent Labs  Lab 10/14/18 1115  AST 29  ALT 17  ALKPHOS 99  BILITOT 1.8*  PROT 8.4*  ALBUMIN 3.6   No results for input(s): LIPASE, AMYLASE in the last 168 hours. No results for input(s): AMMONIA in the last 168 hours. Coagulation Profile: Recent Labs  Lab 10/14/18 1115  INR 1.5*   Cardiac Enzymes: Recent Labs  Lab 10/14/18 2002 10/14/18 2251 10/15/18 0644  TROPONINI 0.06* 0.06* 0.05*   BNP (last 3 results) No results for input(s): PROBNP in the last 8760 hours. HbA1C: No results for input(s): HGBA1C in the last 72 hours. CBG: Recent Labs  Lab 10/14/18 1054 10/14/18 1656 10/14/18 2143 10/15/18 0741  GLUCAP 183* 223* 220* 156*   Lipid Profile: No results for input(s): CHOL, HDL, LDLCALC, TRIG, CHOLHDL, LDLDIRECT in the last 72 hours. Thyroid Function Tests: No results for input(s): TSH, T4TOTAL, FREET4, T3FREE, THYROIDAB in the last 72 hours. Anemia Panel: No results for input(s): VITAMINB12, FOLATE, FERRITIN, TIBC, IRON, RETICCTPCT in the last 72 hours. Sepsis Labs: Recent Labs  Lab 10/14/18 1111 10/14/18 1311 10/15/18 0644  LATICACIDVEN 2.5* 2.2* 1.5    Recent Results (from the past 240 hour(s))  Culture, blood (Routine x 2)     Status: None (Preliminary result)   Collection Time: 10/14/18 11:15 AM   Specimen: BLOOD  Result Value Ref Range Status   Specimen Description   Final    BLOOD BLOOD LEFT HAND Performed at [facility], [place] [address],  [place], [place] [address]    Special Requests   Final    BOTTLES DRAWN AEROBIC ONLY Blood Culture adequate volume Performed at [facility] [address] [place], [place] [address]    Culture   Final    NO GROWTH < 12 HOURS Performed at [facility] [address] [place], [place] [address]    Report Status PENDING  Incomplete  Culture, blood (Routine x 2)     Status: Abnormal (Preliminary result)   Collection Time: 10/14/18 11:16 AM   Specimen: BLOOD  Result Value Ref Range Status   Specimen Description   Final    BLOOD RIGHT ANTECUBITAL Performed at [facility] [address] [place], [place] [address]    Special Requests   Final    BOTTLES DRAWN AEROBIC AND ANAEROBIC Blood Culture adequate volume Performed at [facility] [address], [place], [place] [address]    Culture  Setup Time   Final    IN BOTH AEROBIC AND ANAEROBIC BOTTLES GRAM NEGATIVE RODS CRITICAL RESULT CALLED TO, READ BACK BY AND VERIFIED WITH: B GREEN PHARMD 10/15/18 0040 JDW    Culture (A)  Final  ESCHERICHIA COLI SUSCEPTIBILITIES TO FOLLOW Performed at [facility], [address] [place], [place] [address]    Report Status PENDING  Incomplete  Blood Culture ID Panel (Reflexed)     Status: Abnormal   Collection Time: 10/14/18 11:16 AM  Result Value Ref Range Status   Enterococcus species NOT DETECTED NOT DETECTED Final   Listeria monocytogenes NOT DETECTED NOT DETECTED Final   Staphylococcus species NOT DETECTED NOT DETECTED Final   Staphylococcus aureus (BCID) NOT DETECTED NOT DETECTED Final   Streptococcus species NOT DETECTED NOT DETECTED Final   Streptococcus agalactiae NOT DETECTED NOT DETECTED Final   Streptococcus pneumoniae NOT DETECTED NOT DETECTED Final   Streptococcus pyogenes NOT DETECTED NOT DETECTED Final   Acinetobacter baumannii NOT DETECTED NOT DETECTED Final   Enterobacteriaceae species DETECTED (A) NOT  DETECTED Final    Comment: Enterobacteriaceae represent a large family of gram-negative bacteria, not a single organism. CRITICAL RESULT CALLED TO, READ BACK BY AND VERIFIED WITH: B GREEN PHARMD 10/15/18 0040 JDW    Enterobacter cloacae complex NOT DETECTED NOT DETECTED Final   Escherichia coli DETECTED (A) NOT DETECTED Final    Comment: CRITICAL RESULT CALLED TO, READ BACK BY AND VERIFIED WITH: B GREEN PHARMD 10/15/18 0040 JDW    Klebsiella oxytoca NOT DETECTED NOT DETECTED Final   Klebsiella pneumoniae NOT DETECTED NOT DETECTED Final   Proteus species NOT DETECTED NOT DETECTED Final   Serratia marcescens NOT DETECTED NOT DETECTED Final   Carbapenem resistance NOT DETECTED NOT DETECTED Final   Haemophilus influenzae NOT DETECTED NOT DETECTED Final   Neisseria meningitidis NOT DETECTED NOT DETECTED Final   Pseudomonas aeruginosa NOT DETECTED NOT DETECTED Final   Candida albicans NOT DETECTED NOT DETECTED Final   Candida glabrata NOT DETECTED NOT DETECTED Final   Candida krusei NOT DETECTED NOT DETECTED Final   Candida parapsilosis NOT DETECTED NOT DETECTED Final   Candida tropicalis NOT DETECTED NOT DETECTED Final    Comment: Performed at [facility], [address] [place], [place] [address]  SARS Coronavirus 2 (CEPHEID - Performed in [facility] hospital lab), Hosp Order     Status: None   Collection Time: 10/14/18 11:26 AM   Specimen: Nasopharyngeal Swab  Result Value Ref Range Status   SARS Coronavirus 2 NEGATIVE NEGATIVE Final    Comment: (NOTE) If result is NEGATIVE SARS-CoV-2 target nucleic acids are NOT DETECTED. The SARS-CoV-2 RNA is generally detectable in upper and lower  respiratory specimens during the acute phase of infection. The lowest  concentration of SARS-CoV-2 viral copies this assay can detect is 250  copies / mL. A negative result does not preclude SARS-CoV-2 infection  and should not be used as the sole basis for treatment or other  patient management  decisions.  A negative result may occur with  improper specimen collection / handling, submission of specimen other  than nasopharyngeal swab, presence of viral mutation(s) within the  areas targeted by this assay, and inadequate number of viral copies  (<250 copies / mL). A negative result must be combined with clinical  observations, patient history, and epidemiological information. If result is POSITIVE SARS-CoV-2 target nucleic acids are DETECTED. The SARS-CoV-2 RNA is generally detectable in upper and lower  respiratory specimens dur ing the acute phase of infection.  Positive  results are indicative of active infection with SARS-CoV-2.  Clinical  correlation with patient history and other diagnostic information is  necessary to determine patient infection status.  Positive results do  not rule out bacterial infection or co-infection with other viruses. If result is PRESUMPTIVE POSTIVE SARS-CoV-2 nucleic acids MAY BE PRESENT.   A presumptive positive result was obtained on the submitted specimen  and confirmed on repeat testing.  While 2019 novel coronavirus  (SARS-CoV-2) nucleic acids may be present in the submitted sample  additional confirmatory testing may be necessary for epidemiological  and / or clinical management purposes  to differentiate between  SARS-CoV-2 and other Sarbecovirus currently known to infect humans.  If clinically indicated additional testing with an alternate test  methodology [phone]([id number]) is advised. The SARS-CoV-2 RNA is generally  detectable in upper and lower respiratory sp ecimens during the acute  phase of infection. The expected result is Negative. Fact Sheet for Patients:  BoilerBrush.com.cyhttps://www.fda.gov/media/136312/download Fact Sheet for Healthcare Providers: https://pope.com/https://www.fda.gov/media/136313/download This test is not yet approved or cleared by the [place] FDA and has been authorized for detection and/or diagnosis of SARS-CoV-2 by FDA under an  Emergency Use Authorization (EUA).  This EUA will remain in effect (meaning this test can be used) for the duration of the COVID-19 declaration under Section 564(b)(1) of the Act, 21 U.S.C. section 360bbb-3(b)(1), unless the authorization is terminated or revoked sooner. Performed at [facility], [address] [place], [place] [address]          Radiology Studies: Dg Chest 2 View  Result Date: 10/14/2018 CLINICAL DATA:  Altered mental status, sepsis. EXAM: CHEST - 2 VIEW COMPARISON:  None. FINDINGS: The heart size and mediastinal contours are within normal limits. Both lungs are clear. The visualized skeletal structures are unremarkable. IMPRESSION: No active cardiopulmonary disease. Electronically Signed   By: Lupita RaiderJames  Green Jr M.D.   On: 10/14/2018 11:59        Scheduled Meds: . insulin aspart  0-5 Units Subcutaneous QHS  . insulin aspart  0-9 Units Subcutaneous TID WC  . potassium chloride  40 mEq Oral Q4H   Continuous Infusions: . sodium chloride 1,000 mL (10/15/18 0947)  . [START ON 10/16/2018] cefTRIAXone (ROCEPHIN)  IV       LOS: 0 days   Time spent= 35 mins    Cordelle Dahmen Joline Maxcyhirag Lyrik Buresh, MD [facility]  If 7PM-7AM, please contact night-coverage [email or web address] 10/15/2018, 10:33 AM

## 2018-10-15 NOTE — Evaluation (Signed)
Physical Therapy Evaluation Patient Details Name: Chloe Bradley MRN: [id number] DOB: 01/27/1925 Today's Date: 10/15/2018   History of Present Illness  85 yo female admitted to ED on 6/22 with sepsis secondary to UTI, AMS x 3 weeks. PMH includes HTN, DMII.  Clinical Impression  Pt presents with generalized weakness, increased time and effort to perform mobility tasks, unsteadiness in standing without AD, and decreased activity tolerance. Pt to benefit from acute PT to address deficits. Pt ambulated short hallway distance, requiring termination of further ambulation due to urinary incontinence in hallway. PT recommending HHPT to address mobility deficits post-acutely, pt with good support system at home, as her son lives with her and daughters assist her as needed. PT to progress mobility as tolerated, and will continue to follow acutely.      Follow Up Recommendations Home health PT;Supervision for mobility/OOB    Equipment Recommendations  Rolling walker with 5" wheels    Recommendations for Other Services       Precautions / Restrictions Precautions Precautions: Fall Restrictions Weight Bearing Restrictions: No      Mobility  Bed Mobility Overal bed mobility: Needs Assistance             General bed mobility comments: Pt up in chair upon PT arrival to room, pt left sitting up in chair.  Transfers Overall transfer level: Needs assistance Equipment used: None;Rolling walker (2 wheeled) Transfers: Sit to/from Stand Sit to Stand: Min assist         General transfer comment: Sit to stand x4 during session, x1 from bed post-ambulation, x2 from recliner, and x1 from [facility] (pt requiring min assist for pericare). Requiring min assist for steadying, initially using no AD to stand but reaching for environment. PT recommended RW use for future sit to stands, performed more min guard with RW use. VC for hand placement when rising.  Ambulation/Gait Ambulation/Gait assistance: Min  guard Gait Distance (Feet): 60 Feet Assistive device: Rolling walker (2 wheeled) Gait Pattern/deviations: Step-through pattern;Decreased stride length;Trunk flexed Gait velocity: decr   General Gait Details: Min guard for safety. Verbal cuing for placement in RW, upright posture. Pt limited by urinary incotinence in hallway, required PT assist to clean hallway and pt post-ambulation.  Stairs            Wheelchair Mobility    Modified Rankin (Stroke Patients Only)       Balance Overall balance assessment: Needs assistance Sitting-balance support: No upper extremity supported;Feet supported Sitting balance-Leahy Scale: Good     Standing balance support: No upper extremity supported;During functional activity Standing balance-Leahy Scale: Fair Standing balance comment: able to stand without UE support, but unsteady and reaching for environment                             Pertinent Vitals/Pain Pain Assessment: Faces Faces Pain Scale: Hurts little more Pain Location: bilateral feet, when donning/doffing socks (significant edema) Pain Descriptors / Indicators: Sore Pain Intervention(s): Limited activity within patient's tolerance;Monitored during session;Repositioned    Home Living Family/patient expects to be discharged to:: Private residence Living Arrangements: Children(son lives with, 2 daughters live in town and see pt daily) Available Help at Discharge: Family Type of Home: House Home Access: Stairs to enter Entrance Stairs-Rails: None Technical brewer of Steps: 2 Home Layout: One level Home Equipment: None      Prior Function Level of Independence: Independent         Comments: Pt reports being  very active; sings in church choir, has a group of friends in her church     Hand Dominance   Dominant Hand: Right    Extremity/Trunk Assessment   Upper Extremity Assessment Upper Extremity Assessment: Generalized weakness    Lower  Extremity Assessment Lower Extremity Assessment: Generalized weakness(at least 3/5 hip flexion, knee flexion/extension, DF/PF)    Cervical / Trunk Assessment Cervical / Trunk Assessment: Normal  Communication   Communication: No difficulties  Cognition Arousal/Alertness: Awake/alert Behavior During Therapy: WFL for tasks assessed/performed Overall Cognitive Status: Within Functional Limits for tasks assessed                                 General Comments: Pt A&O x4, quick to respond to PT subjective questions and follows commands well.      General Comments General comments (skin integrity, edema, etc.): edema bilateral feet; compression hose removed due to being soiled by urine.    Exercises     Assessment/Plan    PT Assessment Patient needs continued PT services  PT Problem List Decreased strength;Decreased mobility;Decreased activity tolerance;Decreased balance;Decreased knowledge of use of DME;Pain       PT Treatment Interventions DME instruction;Therapeutic activities;Gait training;Patient/family education;Balance training;Stair training;Therapeutic exercise;Functional mobility training    PT Goals (Current goals can be found in the Care Plan section)  Acute Rehab PT Goals Patient Stated Goal: return home, get stronger PT Goal Formulation: With patient Time For Goal Achievement: 10/29/18 Potential to Achieve Goals: Good    Frequency Min 3X/week   Barriers to discharge        Co-evaluation               AM-PAC PT "6 Clicks" Mobility  Outcome Measure Help needed turning from your back to your side while in a flat bed without using bedrails?: A Little Help needed moving from lying on your back to sitting on the side of a flat bed without using bedrails?: A Little Help needed moving to and from a bed to a chair (including a wheelchair)?: A Little Help needed standing up from a chair using your arms (e.g., wheelchair or bedside chair)?: A  Little Help needed to walk in hospital room?: A Little Help needed climbing 3-5 steps with a railing? : A Little 6 Click Score: 18    End of Session Equipment Utilized During Treatment: Gait belt Activity Tolerance: Patient tolerated treatment well;Other (comment)(limited by urinary incotinence in hallway) Patient left: in chair;with chair alarm set;with call bell/phone within reach Nurse Communication: Mobility status PT Visit Diagnosis: Muscle weakness (generalized) (M62.81);Other abnormalities of gait and mobility (R26.89)    Time: [id number] PT Time Calculation (min) (ACUTE ONLY): 31 min   Charges:   PT Evaluation $PT Eval Low Complexity: 1 Low PT Treatments $Gait Training: 8-22 mins        Nicola PoliceAlexa D Kamarius Buckbee, PT Acute Rehabilitation Services Pager [phone]  Office [phone]   Reyonna Haack D Kimball Manske 10/15/2018, 1:19 PM

## 2018-10-15 NOTE — Progress Notes (Signed)
PHARMACY - PHYSICIAN COMMUNICATION CRITICAL VALUE ALERT - BLOOD CULTURE IDENTIFICATION (BCID)  Chloe Bradley is an 85 y.o. female who presented to [facility] on 10/14/2018 with a chief complaint of fever  Assessment:  Urine? (include suspected source if known) BCID 2 of 3 bottles: e-coli WBCs = 18, a-febrile Name of physician (or Provider) ContactedGeorges Mouse, MD  Current antibiotics: Rocephin 1 Gm IV q24h  Changes to prescribed antibiotics recommended:  Patient is on recommended antibiotics - No changes needed  Will increase dose to 2 Gm for bacteremia  Results for orders placed or performed during the hospital encounter of 10/14/18  Blood Culture ID Panel (Reflexed) (Collected: 10/14/2018 11:16 AM)  Result Value Ref Range   Enterococcus species NOT DETECTED NOT DETECTED   Listeria monocytogenes NOT DETECTED NOT DETECTED   Staphylococcus species NOT DETECTED NOT DETECTED   Staphylococcus aureus (BCID) NOT DETECTED NOT DETECTED   Streptococcus species NOT DETECTED NOT DETECTED   Streptococcus agalactiae NOT DETECTED NOT DETECTED   Streptococcus pneumoniae NOT DETECTED NOT DETECTED   Streptococcus pyogenes NOT DETECTED NOT DETECTED   Acinetobacter baumannii NOT DETECTED NOT DETECTED   Enterobacteriaceae species DETECTED (A) NOT DETECTED   Enterobacter cloacae complex NOT DETECTED NOT DETECTED   Escherichia coli DETECTED (A) NOT DETECTED   Klebsiella oxytoca NOT DETECTED NOT DETECTED   Klebsiella pneumoniae NOT DETECTED NOT DETECTED   Proteus species NOT DETECTED NOT DETECTED   Serratia marcescens NOT DETECTED NOT DETECTED   Carbapenem resistance NOT DETECTED NOT DETECTED   Haemophilus influenzae NOT DETECTED NOT DETECTED   Neisseria meningitidis NOT DETECTED NOT DETECTED   Pseudomonas aeruginosa NOT DETECTED NOT DETECTED   Candida albicans NOT DETECTED NOT DETECTED   Candida glabrata NOT DETECTED NOT DETECTED   Candida krusei NOT DETECTED NOT DETECTED   Candida parapsilosis  NOT DETECTED NOT DETECTED   Candida tropicalis NOT DETECTED NOT DETECTED    [name] 10/15/2018  12:47 AM

## 2018-10-16 LAB — CBC
HCT: 28 % — ABNORMAL LOW (ref 36.0–46.0)
Hemoglobin: 9.5 g/dL — ABNORMAL LOW (ref 12.0–15.0)
MCH: 31.3 pg (ref 26.0–34.0)
MCHC: 33.9 g/dL (ref 30.0–36.0)
MCV: 92.1 fL (ref 80.0–100.0)
Platelets: 122 10*3/uL — ABNORMAL LOW (ref 150–400)
RBC: 3.04 MIL/uL — ABNORMAL LOW (ref 3.87–5.11)
RDW: 13.8 % (ref 11.5–15.5)
WBC: 16.4 10*3/uL — ABNORMAL HIGH (ref 4.0–10.5)
nRBC: 0 % (ref 0.0–0.2)

## 2018-10-16 LAB — CULTURE, BLOOD (ROUTINE X 2): Special Requests: ADEQUATE

## 2018-10-16 LAB — BASIC METABOLIC PANEL
Anion gap: 11 (ref 5–15)
BUN: 31 mg/dL — ABNORMAL HIGH (ref 8–23)
CO2: 23 mmol/L (ref 22–32)
Calcium: 8.4 mg/dL — ABNORMAL LOW (ref 8.9–10.3)
Chloride: 106 mmol/L (ref 98–111)
Creatinine, Ser: 1.2 mg/dL — ABNORMAL HIGH (ref 0.44–1.00)
GFR calc Af Amer: 46 mL/min — ABNORMAL LOW (ref >60)
GFR calc non Af Amer: 39 mL/min — ABNORMAL LOW (ref >60)
Glucose, Bld: 125 mg/dL — ABNORMAL HIGH (ref 70–99)
Potassium: 2.9 mmol/L — ABNORMAL LOW (ref 3.5–5.1)
Sodium: 140 mmol/L (ref 135–145)

## 2018-10-16 LAB — GLUCOSE, CAPILLARY
Glucose-Capillary: 117 mg/dL — ABNORMAL HIGH (ref 70–99)
Glucose-Capillary: 144 mg/dL — ABNORMAL HIGH (ref 70–99)

## 2018-10-16 LAB — MAGNESIUM: Magnesium: 2 mg/dL (ref 1.7–2.4)

## 2018-10-16 MED ORDER — SODIUM CHLORIDE 0.9 % IV SOLN
INTRAVENOUS | Status: DC | PRN
  Administered 2018-10-16: 250 mL via INTRAVENOUS

## 2018-10-16 MED ORDER — POTASSIUM CHLORIDE 10 MEQ/100ML IV SOLN
10.0000 meq | INTRAVENOUS | Status: AC
  Administered 2018-10-16 (×4): 10 meq via INTRAVENOUS
  Filled 2018-10-16 (×4): qty 100

## 2018-10-16 MED ORDER — POTASSIUM CHLORIDE CRYS ER 20 MEQ PO TBCR
40.0000 meq | EXTENDED_RELEASE_TABLET | ORAL | Status: AC
  Administered 2018-10-16 (×2): 40 meq via ORAL
  Filled 2018-10-16 (×2): qty 2

## 2018-10-16 MED ORDER — CEFPODOXIME PROXETIL 100 MG PO TABS: 100.0000 mg | ORAL_TABLET | Freq: Two times a day (BID) | ORAL | 0 refills | Status: AC

## 2018-10-16 NOTE — Evaluation (Signed)
Occupational Therapy Evaluation Patient Details Name: Chloe Bradley MRN: [id number] DOB: 01/27/1925 Today's Date: 10/16/2018    History of Present Illness 85 yo female admitted to ED on 6/22 with sepsis secondary to UTI, AMS x 3 weeks. PMH includes HTN, DMII.   Clinical Impression   This 85 y/o female presents with the above. PTA pt reports independent with ADL and functional mobility. Pt presents supine in bed pleasant and willing to participate in therapy session. Pt performing functional transfers using RW with overall minguard assist. She currently requires setup/supervision for seated UB ADL, min-modA for LB and toileting ADL. Pt will benefit from continued acute OT services and recommend follow up [facility] services after discharge to maximize her safety and independence with ADL and mobility. Will follow.     Follow Up Recommendations  Home health OT;Supervision/Assistance - 24 hour    Equipment Recommendations  None recommended by OT           Precautions / Restrictions Precautions Precautions: Fall Restrictions Weight Bearing Restrictions: No      Mobility Bed Mobility Overal bed mobility: Needs Assistance Bed Mobility: Supine to Sit     Supine to sit: Supervision     General bed mobility comments: for general safety, increased effort and HOB slightly elevated, no physical assist required  Transfers Overall transfer level: Needs assistance Equipment used: Rolling walker (2 wheeled) Transfers: Sit to/from [facility] Sit to Stand: Min guard Stand pivot transfers: Min guard       General transfer comment: close minguard for safety/balance. pt transitioning EOB > BSC and then taking few steps to transfer to recliner end of session     Balance Overall balance assessment: Needs assistance Sitting-balance support: Feet supported;No upper extremity supported Sitting balance-Leahy Scale: Good     Standing balance support: During functional  activity;Single extremity supported;Bilateral upper extremity supported Standing balance-Leahy Scale: Fair                             ADL either performed or assessed with clinical judgement   ADL Overall ADL's : Needs assistance/impaired Eating/Feeding: Modified independent;Sitting   Grooming: Set up;Sitting;Wash/dry hands   Upper Body Bathing: Set up;Supervision/ safety;Sitting   Lower Body Bathing: Minimal assistance;Sit to/from stand   Upper Body Dressing : Set up;Min guard;Sitting   Lower Body Dressing: Moderate assistance;Sit to/from stand Lower Body Dressing Details (indicate cue type and reason): some assist to thread mesh underwear over RLE, pt with better ease of donning LLE, light minA-minguard for standing balance when advancing over hips Toilet Transfer: Min Press photographer Details (indicate cue type and reason): use of BSC due to pt urgency to urinate Toileting- Clothing Manipulation and Hygiene: Minimal assistance;Sit to/from stand Toileting - Clothing Manipulation Details (indicate cue type and reason): close minguard-minA for balance/clothing management including gown/mesh underwear. pt performing peri-care both sitting via lateral leans/and in standing     Functional mobility during ADLs: Min guard;Rolling walker                           Pertinent Vitals/Pain Pain Assessment: No/denies pain     Hand Dominance Right   Extremity/Trunk Assessment Upper Extremity Assessment Upper Extremity Assessment: Generalized weakness   Lower Extremity Assessment Lower Extremity Assessment: Defer to PT evaluation       Communication Communication Communication: No difficulties   Cognition Arousal/Alertness: Awake/alert Behavior During Therapy: [facility] for tasks assessed/performed  Overall Cognitive Status: Within Functional Limits for tasks assessed                                                       Home Living Family/patient expects to be discharged to:: Private residence Living Arrangements: Children(son lives with, 2 daughters live in town and see pt daily) Available Help at Discharge: Family Type of Home: House Home Access: Stairs to enter Secretary/administratorntrance Stairs-Number of Steps: 2 Entrance Stairs-Rails: None Home Layout: One level         FirefighterBathroom Toilet: Standard     Home Equipment: Shower seat          Prior Functioning/Environment Level of Independence: Independent        Comments: Pt reports being very active; sings in church choir, has a group of friends in her church        OT Problem List: Decreased strength;Decreased range of motion;Decreased activity tolerance;Impaired balance (sitting and/or standing);Decreased knowledge of use of DME or AE;Obesity      OT Treatment/Interventions: Self-care/ADL training;Therapeutic exercise;Neuromuscular education;DME and/or AE instruction;Therapeutic activities;Patient/family education;Balance training    OT Goals(Current goals can be found in the care plan section) Acute Rehab OT Goals Patient Stated Goal: hopeful for home today OT Goal Formulation: With patient Time For Goal Achievement: 10/30/18 Potential to Achieve Goals: Good  OT Frequency: Min 2X/week   Barriers to D/C:            Co-evaluation              AM-PAC OT "6 Clicks" Daily Activity     Outcome Measure Help from another person eating meals?: None Help from another person taking care of personal grooming?: A Little Help from another person toileting, which includes using toliet, bedpan, or urinal?: A Little Help from another person bathing (including washing, rinsing, drying)?: A Little Help from another person to put on and taking off regular upper body clothing?: None Help from another person to put on and taking off regular lower body clothing?: A Little 6 Click Score: 20   End of Session Equipment Utilized During Treatment: Gait  belt Nurse Communication: Mobility status  Activity Tolerance: Patient tolerated treatment well Patient left: in chair;with call bell/phone within reach;with chair alarm set  OT Visit Diagnosis: Unsteadiness on feet (R26.81);Muscle weakness (generalized) (M62.81)                Time: [id number] OT Time Calculation (min): 30 min Charges:  OT General Charges $OT Visit: 1 Visit OT Evaluation $OT Eval Moderate Complexity: 1 Mod OT Treatments $Self Care/Home Management : 8-22 mins  Marcy SirenBreanna Okey Zelek, OT Supplemental Rehabilitation Services Pager [phone] Office [phone]  Orlando PennerBreanna L Joie Reamer 10/16/2018, 12:21 PM

## 2018-10-16 NOTE — TOC Transition Note (Signed)
Transition of Care [facility] - CM/SW Discharge Note   Patient Details  Name: Chloe Bradley MRN: [id number] Date of Birth: 01/27/1925  [facility] CM/SW Contact:  Dessa Phi, RN Phone Number: 10/16/2018, 11:45 AM   Clinical Narrative: Patient deferred to dtr Julien Girt to [place] agreed to [facility] choice [place] with [facility] 10/22/18-all agreed-MD also. Patient has rw. Faxed w/confirmation d/c summary, HHC orders, & face to face. Family will transport home. No further CM needs.      Final next level of care: [place] Barriers to Discharge: No Barriers Identified   Patient Goals and CMS Choice Patient states their goals for this hospitalization and ongoing recovery are:: go home CMS Medicare.gov Compare Post Acute Care list provided to:: Patient Represenative (must comment)(dtr-Lenna) Choice offered to / list presented to : Adult Children  Discharge Placement                       Discharge Plan and Services                          HH Arranged: PT, OT, Nurse's Aide HH Agency: [place] Date [facility] [place] Agency Contacted: 10/16/18 Time [place]: 1143 Representative spoke with at [place]: Sally/Lauren  Social Determinants of Health ([place]) Interventions     Readmission Risk Interventions No flowsheet data found.

## 2018-10-16 NOTE — Discharge Summary (Signed)
Physician Discharge Summary  Doris CheadleOuida R Jobe JXB:[id number] DOB: 01/27/1925 DOA: 10/14/2018  PCP: Mirna MiresHill, Gerald, MD  Admit date: 10/14/2018 Discharge date: 10/16/2018  Admitted From: Home Disposition: Home  Recommendations for Outpatient Follow-up:  1. Follow up with PCP in 1-2 weeks 2. Please obtain BMP/CBC in one week your next doctors visit.  3. 5 days of oral Vantin 4. Home health arrangements to be made  Home Health: PT ordered Equipment/Devices: None Discharge Condition: Stable CODE STATUS: Full code Diet recommendation: Diabetic  Brief/Interim Summary: 85 year old with history of hypertension, diabetes mellitus type 2 admitted for worsening weakness and confusion.  She was diagnosed with urinary tract infection with bacteremia.  She was also noted to have acute kidney injury requiring IV fluids.Her blood cultures ended up growing E. coli which was pansensitive therefore Rocephin was transitioned to oral Vantin to be taken for 5 more days.  She remained afebrile.  Lactate trended down.  Her kidney numbers returned back to baseline.  WBC trended down.  On the day of discharge she was hypokalemic which was repleted prior to her discharge. Spoke with her daughter answered all the questions.  Stable for discharge today.   Discharge Diagnoses:  Principal Problem:   Sepsis secondary to UTI [facility](HCC) Active Problems:   AKI (acute kidney injury) (HCC)   Generalized weakness   HTN (hypertension)   DM (diabetes mellitus) (HCC)   UTI (urinary tract infection)   Pressure injury of skin  Sepsis secondary to urinary tract infection, POA E. coli bacteremia, pansensitive -Culture data shows was pansensitive, will transition to oral Vantin for 5 more days.  Advised to continue oral hydration. -COVID-negative  Acute kidney injury, prerenal Moderate dehydration -Unknown baseline.    Creatinine has trended down to 1.2.  I advised her to continue oral hydration at  home.  Hypokalemia -Today's 2.9 which will be repleted prior to her discharge  Essential hypertension -Home medications on hold to avoid hypotension.  Diabetes mellitus type 2 -On Tradjenta which can be resumed on discharge  Generalized weakness due to above-mentioned assessments -PT/OT who recommended home health therefore arrangements made  DVT prophylaxis: SCDs Code Status: Full code Family Communication: Spoke with the patient's daughter over the phone on the day of discharge as well Disposition Plan:  Discharge today  Consultations:  None  Subjective: Feels much better.  Wishes to go home.  Discharge Exam: Vitals:   10/15/18 2058 10/16/18 0548  BP: (!) 143/101 138/68  Pulse: 84 81  Resp: (!) 22 16  Temp: 98.4 F (36.9 C) 98.3 F (36.8 C)  SpO2: 98% 97%   Vitals:   10/15/18 0757 10/15/18 1323 10/15/18 2058 10/16/18 0548  BP:  (!) 145/72 (!) 143/101 138/68  Pulse:  77 84 81  Resp:  (!) 21 (!) 22 16  Temp: 98.9 F (37.2 C) 98.4 F (36.9 C) 98.4 F (36.9 C) 98.3 F (36.8 C)  TempSrc: Oral Oral Oral Oral  SpO2:  100% 98% 97%  Weight:      Height:        General: Pt is alert, awake, not in acute distress Cardiovascular: RRR, S1/S2 +, no rubs, no gallops Respiratory: CTA bilaterally, no wheezing, no rhonchi Abdominal: Soft, NT, ND, bowel sounds + Extremities: no edema, no cyanosis  Discharge Instructions  Discharge Instructions    Call MD for:  difficulty breathing, headache or visual disturbances   Complete by: As directed    Diet - low sodium heart healthy   Complete by: As directed  Increase activity slowly   Complete by: As directed      Allergies as of 10/16/2018   No Known Allergies     Medication List    TAKE these medications   cefpodoxime 100 MG tablet Commonly known as: VANTIN Take 1 tablet (100 mg total) by mouth 2 (two) times daily for 5 days.   Edarbyclor 40-12.5 MG Tabs Generic drug: Azilsartan-Chlorthalidone Take 1  tablet by mouth every evening.   felodipine 10 MG 24 hr tablet Commonly known as: PLENDIL Take 10 mg by mouth every evening.   Tradjenta 5 MG Tabs tablet Generic drug: linagliptin Take 5 mg by mouth every evening.            Durable Medical Equipment  (From admission, onward)         Start     Ordered   10/16/18 0757  For home use only DME Walker rolling  Once    Question:  Patient needs a walker to treat with the following condition  Answer:  Ambulatory dysfunction   10/16/18 0756         Follow-up Information    Mirna MiresHill, Gerald, MD. Schedule an appointment as soon as possible for a visit in 2 week(s).   Specialty: Family Medicine Contact information: [address] [place] [phone]          No Known Allergies  You were cared for by a hospitalist during your hospital stay. If you have any questions about your discharge medications or the care you received while you were in the hospital after you are discharged, you can call the unit and asked to speak with the hospitalist on call if the hospitalist that took care of you is not available. Once you are discharged, your primary care physician will handle any further medical issues. Please note that no refills for any discharge medications will be authorized once you are discharged, as it is imperative that you return to your primary care physician (or establish a relationship with a primary care physician if you do not have one) for your aftercare needs so that they can reassess your need for medications and monitor your lab values.   Procedures/Studies: Dg Chest 2 View  Result Date: 10/14/2018 CLINICAL DATA:  Altered mental status, sepsis. EXAM: CHEST - 2 VIEW COMPARISON:  None. FINDINGS: The heart size and mediastinal contours are within normal limits. Both lungs are clear. The visualized skeletal structures are unremarkable. IMPRESSION: No active cardiopulmonary disease. Electronically Signed   By:  Lupita RaiderJames  Green Jr M.D.   On: 10/14/2018 11:59      The results of significant diagnostics from this hospitalization (including imaging, microbiology, ancillary and laboratory) are listed below for reference.     Microbiology: Recent Results (from the past 240 hour(s))  Culture, blood (Routine x 2)     Status: None (Preliminary result)   Collection Time: 10/14/18 11:15 AM   Specimen: BLOOD  Result Value Ref Range Status   Specimen Description   Final    BLOOD BLOOD LEFT HAND Performed at [facility], [address] [place], [place] [address]    Special Requests   Final    BOTTLES DRAWN AEROBIC ONLY Blood Culture adequate volume Performed at [facility], [address] [place], [place] [address]    Culture   Final    NO GROWTH < 12 HOURS Performed at [facility], [address] [place], [place] [address]  Report Status PENDING  Incomplete  Culture, blood (Routine x 2)     Status: Abnormal   Collection Time: 10/14/18 11:16 AM   Specimen: BLOOD  Result Value Ref Range Status   Specimen Description   Final    BLOOD RIGHT ANTECUBITAL Performed at [facility] [address], [place], [place] [address]    Special Requests   Final    BOTTLES DRAWN AEROBIC AND ANAEROBIC Blood Culture adequate volume Performed at [facility] [address], [place], [place] [address]    Culture  Setup Time   Final    IN BOTH AEROBIC AND ANAEROBIC BOTTLES GRAM NEGATIVE RODS CRITICAL RESULT CALLED TO, READ BACK BY AND VERIFIED WITH: B GREEN PHARMD 10/15/18 0040 JDW Performed at [facility], [place] [address] [place],  [address]    Culture ESCHERICHIA COLI (A)  Final   Report Status 10/16/2018 FINAL  Final   Organism ID, Bacteria ESCHERICHIA COLI  Final      Susceptibility   Escherichia coli - MIC*    AMPICILLIN <=2 SENSITIVE Sensitive     CEFAZOLIN <=4 SENSITIVE Sensitive     CEFEPIME <=1  SENSITIVE Sensitive     CEFTAZIDIME <=1 SENSITIVE Sensitive     CEFTRIAXONE <=1 SENSITIVE Sensitive     CIPROFLOXACIN <=0.25 SENSITIVE Sensitive     GENTAMICIN <=1 SENSITIVE Sensitive     IMIPENEM <=0.25 SENSITIVE Sensitive     TRIMETH/SULFA <=20 SENSITIVE Sensitive     AMPICILLIN/SULBACTAM <=2 SENSITIVE Sensitive     PIP/TAZO <=4 SENSITIVE Sensitive     Extended ESBL NEGATIVE Sensitive     * ESCHERICHIA COLI  Blood Culture ID Panel (Reflexed)     Status: Abnormal   Collection Time: 10/14/18 11:16 AM  Result Value Ref Range Status   Enterococcus species NOT DETECTED NOT DETECTED Final   Listeria monocytogenes NOT DETECTED NOT DETECTED Final   Staphylococcus species NOT DETECTED NOT DETECTED Final   Staphylococcus aureus (BCID) NOT DETECTED NOT DETECTED Final   Streptococcus species NOT DETECTED NOT DETECTED Final   Streptococcus agalactiae NOT DETECTED NOT DETECTED Final   Streptococcus pneumoniae NOT DETECTED NOT DETECTED Final   Streptococcus pyogenes NOT DETECTED NOT DETECTED Final   Acinetobacter baumannii NOT DETECTED NOT DETECTED Final   Enterobacteriaceae species DETECTED (A) NOT DETECTED Final    Comment: Enterobacteriaceae represent a large family of gram-negative bacteria, not a single organism. CRITICAL RESULT CALLED TO, READ BACK BY AND VERIFIED WITH: B GREEN PHARMD 10/15/18 0040 JDW    Enterobacter cloacae complex NOT DETECTED NOT DETECTED Final   Escherichia coli DETECTED (A) NOT DETECTED Final    Comment: CRITICAL RESULT CALLED TO, READ BACK BY AND VERIFIED WITH: B GREEN PHARMD 10/15/18 0040 JDW    Klebsiella oxytoca NOT DETECTED NOT DETECTED Final   Klebsiella pneumoniae NOT DETECTED NOT DETECTED Final   Proteus species NOT DETECTED NOT DETECTED Final   Serratia marcescens NOT DETECTED NOT DETECTED Final   Carbapenem resistance NOT DETECTED NOT DETECTED Final   Haemophilus influenzae NOT DETECTED NOT DETECTED Final   Neisseria meningitidis NOT DETECTED NOT  DETECTED Final   Pseudomonas aeruginosa NOT DETECTED NOT DETECTED Final   Candida albicans NOT DETECTED NOT DETECTED Final   Candida glabrata NOT DETECTED NOT DETECTED Final   Candida krusei NOT DETECTED NOT DETECTED Final   Candida parapsilosis NOT DETECTED NOT DETECTED Final   Candida tropicalis NOT DETECTED NOT DETECTED Final    Comment: Performed at [facility], [address] [place], [place] [address]  Urine culture     Status: Abnormal   Collection Time: 10/14/18 11:26 AM   Specimen: Urine, Clean Catch  Result Value Ref Range Status   Specimen Description   Final    URINE, CLEAN CATCH Performed at [facility], [address] [place], [place] [address]    Special Requests   Final    NONE Performed at [facility], [address], [place], [place] [address]    Culture (A)  Final    <10,000 COLONIES/mL INSIGNIFICANT GROWTH Performed at [facility], [address], [place], [place] [address]    Report Status 10/15/2018 FINAL  Final  SARS Coronavirus 2 (CEPHEID - Performed in [facility] hospital lab), Hosp Order     Status: None   Collection Time: 10/14/18 11:26 AM   Specimen: Nasopharyngeal Swab  Result Value Ref Range Status   SARS Coronavirus 2 NEGATIVE NEGATIVE Final    Comment: (NOTE) If result is NEGATIVE SARS-CoV-2 target nucleic acids are NOT DETECTED. The SARS-CoV-2 RNA is generally detectable in upper and lower  respiratory specimens during the acute phase of infection. The lowest  concentration of SARS-CoV-2 viral copies this assay can detect is 250  copies / mL. A negative result does not preclude SARS-CoV-2 infection  and should not be used as the sole basis for treatment or other  patient management decisions.  A negative result may occur with  improper specimen collection / handling, submission of specimen other  than nasopharyngeal swab, presence of viral mutation(s) within the  areas targeted by this  assay, and inadequate number of viral copies  (<250 copies / mL). A negative result must be combined with clinical  observations, patient history, and epidemiological information. If result is POSITIVE SARS-CoV-2 target nucleic acids are DETECTED. The SARS-CoV-2 RNA is generally detectable in upper and lower  respiratory specimens dur ing the acute phase of infection.  Positive  results are indicative of active infection with SARS-CoV-2.  Clinical  correlation with patient history and other diagnostic information is  necessary to determine patient infection status.  Positive results do  not rule out bacterial infection or co-infection with other viruses. If result is PRESUMPTIVE POSTIVE SARS-CoV-2 nucleic acids MAY BE PRESENT.   A presumptive positive result was obtained on the submitted specimen  and confirmed on repeat testing.  While 2019 novel coronavirus  (SARS-CoV-2) nucleic acids may be present in the submitted sample  additional confirmatory testing may be necessary for epidemiological  and / or clinical management purposes  to differentiate between  SARS-CoV-2 and other Sarbecovirus currently known to infect humans.  If clinically indicated additional testing with an alternate test  methodology [phone]) is advised. The SARS-CoV-2 RNA is generally  detectable in upper and lower respiratory sp ecimens during the acute  phase of infection. The expected result is Negative. Fact Sheet for Patients:  BoilerBrush.com.cy Fact Sheet for Healthcare Providers: https://pope.com/ This test is not yet approved or cleared by the [place] FDA and has been authorized for detection and/or diagnosis of SARS-CoV-2 by FDA under an Emergency Use Authorization (EUA).  This EUA will remain in effect (meaning this test can be used) for the duration of the COVID-19 declaration under Section 564(b)(1) of the Act, 21 U.S.C. section 360bbb-3(b)(1),  unless the authorization is terminated or revoked sooner. Performed at [facility], [address], [place], [place] [address]  Labs: BNP (last 3 results) No results for input(s): BNP in the last 8760 hours. Basic Metabolic Panel: Recent Labs  Lab 10/14/18 1115 10/15/18 0644 10/16/18 0327  NA 132* 139 140  K 3.3* 3.0* 2.9*  CL 93* 104 106  CO2 23 21* 23  GLUCOSE 224* 188* 125*  BUN 45* 38* 31*  CREATININE 1.92* 1.46* 1.20*  CALCIUM 8.8* 8.6* 8.4*  MG  --   --  2.0   Liver Function Tests: Recent Labs  Lab 10/14/18 1115  AST 29  ALT 17  ALKPHOS 99  BILITOT 1.8*  PROT 8.4*  ALBUMIN 3.6   No results for input(s): LIPASE, AMYLASE in the last 168 hours. No results for input(s): AMMONIA in the last 168 hours. CBC: Recent Labs  Lab 10/14/18 1115 10/15/18 0644 10/16/18 0327  WBC 18.0* 20.0* 16.4*  NEUTROABS 15.7*  --   --   HGB 11.1* 10.5* 9.5*  HCT 34.4* 32.7* 28.0*  MCV 94.8 94.0 92.1  PLT 150 122* 122*   Cardiac Enzymes: Recent Labs  Lab 10/14/18 2002 10/14/18 2251 10/15/18 0644 10/15/18 1111  TROPONINI 0.06* 0.06* 0.05* 0.04*   BNP: Invalid input(s): POCBNP CBG: Recent Labs  Lab 10/15/18 0741 10/15/18 1132 10/15/18 1704 10/15/18 2101 10/16/18 0805  GLUCAP 156* 128* 156* 107* 117*   D-Dimer No results for input(s): DDIMER in the last 72 hours. Hgb A1c No results for input(s): HGBA1C in the last 72 hours. Lipid Profile No results for input(s): CHOL, HDL, LDLCALC, TRIG, CHOLHDL, LDLDIRECT in the last 72 hours. Thyroid function studies Recent Labs    10/15/18 1111  TSH <0.010*   Anemia work up No results for input(s): VITAMINB12, FOLATE, FERRITIN, TIBC, IRON, RETICCTPCT in the last 72 hours. Urinalysis    Component Value Date/Time   COLORURINE YELLOW 10/14/2018 1111   APPEARANCEUR CLEAR 10/14/2018 1111   LABSPEC 1.008 10/14/2018 1111   PHURINE 6.0 10/14/2018 1111   GLUCOSEU >=500 (A) 10/14/2018 1111   HGBUR  MODERATE (A) 10/14/2018 1111   BILIRUBINUR NEGATIVE 10/14/2018 1111   KETONESUR 5 (A) 10/14/2018 1111   PROTEINUR 30 (A) 10/14/2018 1111   NITRITE POSITIVE (A) 10/14/2018 1111   LEUKOCYTESUR SMALL (A) 10/14/2018 1111   Sepsis Labs Invalid input(s): PROCALCITONIN,  WBC,  LACTICIDVEN Microbiology Recent Results (from the past 240 hour(s))  Culture, blood (Routine x 2)     Status: None (Preliminary result)   Collection Time: 10/14/18 11:15 AM   Specimen: BLOOD  Result Value Ref Range Status   Specimen Description   Final    BLOOD BLOOD LEFT HAND Performed at [facility], [address], [place], [place] [address]    Special Requests   Final    BOTTLES DRAWN AEROBIC ONLY Blood Culture adequate volume Performed at [facility], [address], [place], [place] [address]    Culture   Final    NO GROWTH < 12 HOURS Performed at [facility], [address] [place], [place] [address]    Report Status PENDING  Incomplete  Culture, blood (Routine x 2)     Status: Abnormal   Collection Time: 10/14/18 11:16 AM   Specimen: BLOOD  Result Value Ref Range Status   Specimen Description   Final    BLOOD RIGHT ANTECUBITAL Performed at [facility], [address], [place], [place] [address]    Special Requests   Final    BOTTLES DRAWN AEROBIC AND ANAEROBIC Blood Culture adequate volume Performed  at [facility], [address] [place], [place] [address]    Culture  Setup Time   Final    IN BOTH AEROBIC AND ANAEROBIC BOTTLES GRAM NEGATIVE RODS CRITICAL RESULT CALLED TO, READ BACK BY AND VERIFIED WITH: B GREEN PHARMD 10/15/18 0040 JDW Performed at [facility], [address] [place], [place] [address]    Culture ESCHERICHIA COLI (A)  Final   Report Status 10/16/2018 FINAL  Final   Organism ID, Bacteria ESCHERICHIA COLI  Final      Susceptibility   Escherichia coli - MIC*    AMPICILLIN <=2 SENSITIVE  Sensitive     CEFAZOLIN <=4 SENSITIVE Sensitive     CEFEPIME <=1 SENSITIVE Sensitive     CEFTAZIDIME <=1 SENSITIVE Sensitive     CEFTRIAXONE <=1 SENSITIVE Sensitive     CIPROFLOXACIN <=0.25 SENSITIVE Sensitive     GENTAMICIN <=1 SENSITIVE Sensitive     IMIPENEM <=0.25 SENSITIVE Sensitive     TRIMETH/SULFA <=20 SENSITIVE Sensitive     AMPICILLIN/SULBACTAM <=2 SENSITIVE Sensitive     PIP/TAZO <=4 SENSITIVE Sensitive     Extended ESBL NEGATIVE Sensitive     * ESCHERICHIA COLI  Blood Culture ID Panel (Reflexed)     Status: Abnormal   Collection Time: 10/14/18 11:16 AM  Result Value Ref Range Status   Enterococcus species NOT DETECTED NOT DETECTED Final   Listeria monocytogenes NOT DETECTED NOT DETECTED Final   Staphylococcus species NOT DETECTED NOT DETECTED Final   Staphylococcus aureus (BCID) NOT DETECTED NOT DETECTED Final   Streptococcus species NOT DETECTED NOT DETECTED Final   Streptococcus agalactiae NOT DETECTED NOT DETECTED Final   Streptococcus pneumoniae NOT DETECTED NOT DETECTED Final   Streptococcus pyogenes NOT DETECTED NOT DETECTED Final   Acinetobacter baumannii NOT DETECTED NOT DETECTED Final   Enterobacteriaceae species DETECTED (A) NOT DETECTED Final    Comment: Enterobacteriaceae represent a large family of gram-negative bacteria, not a single organism. CRITICAL RESULT CALLED TO, READ BACK BY AND VERIFIED WITH: B GREEN PHARMD 10/15/18 0040 JDW    Enterobacter cloacae complex NOT DETECTED NOT DETECTED Final   Escherichia coli DETECTED (A) NOT DETECTED Final    Comment: CRITICAL RESULT CALLED TO, READ BACK BY AND VERIFIED WITH: B GREEN PHARMD 10/15/18 0040 JDW    Klebsiella oxytoca NOT DETECTED NOT DETECTED Final   Klebsiella pneumoniae NOT DETECTED NOT DETECTED Final   Proteus species NOT DETECTED NOT DETECTED Final   Serratia marcescens NOT DETECTED NOT DETECTED Final   Carbapenem resistance NOT DETECTED NOT DETECTED Final   Haemophilus influenzae NOT DETECTED  NOT DETECTED Final   Neisseria meningitidis NOT DETECTED NOT DETECTED Final   Pseudomonas aeruginosa NOT DETECTED NOT DETECTED Final   Candida albicans NOT DETECTED NOT DETECTED Final   Candida glabrata NOT DETECTED NOT DETECTED Final   Candida krusei NOT DETECTED NOT DETECTED Final   Candida parapsilosis NOT DETECTED NOT DETECTED Final   Candida tropicalis NOT DETECTED NOT DETECTED Final    Comment: Performed at [facility], [address] [place], [place] [address]  Urine culture     Status: Abnormal   Collection Time: 10/14/18 11:26 AM   Specimen: Urine, Clean Catch  Result Value Ref Range Status   Specimen Description   Final    URINE, CLEAN CATCH Performed at [facility], [address] [place], [place] [address]    Special Requests   Final    NONE Performed at [facility],  [address], [place], [place] [address]    Culture (A)  Final    <10,000 COLONIES/mL INSIGNIFICANT GROWTH Performed at [facility], [address] [place], [place] [address]    Report Status 10/15/2018 FINAL  Final  SARS Coronavirus 2 (CEPHEID - Performed in [facility] hospital lab), Hosp Order     Status: None   Collection Time: 10/14/18 11:26 AM   Specimen: Nasopharyngeal Swab  Result Value Ref Range Status   SARS Coronavirus 2 NEGATIVE NEGATIVE Final    Comment: (NOTE) If result is NEGATIVE SARS-CoV-2 target nucleic acids are NOT DETECTED. The SARS-CoV-2 RNA is generally detectable in upper and lower  respiratory specimens during the acute phase of infection. The lowest  concentration of SARS-CoV-2 viral copies this assay can detect is 250  copies / mL. A negative result does not preclude SARS-CoV-2 infection  and should not be used as the sole basis for treatment or other  patient management decisions.  A negative result may occur with  improper specimen collection / handling, submission of specimen other  than nasopharyngeal swab,  presence of viral mutation(s) within the  areas targeted by this assay, and inadequate number of viral copies  (<250 copies / mL). A negative result must be combined with clinical  observations, patient history, and epidemiological information. If result is POSITIVE SARS-CoV-2 target nucleic acids are DETECTED. The SARS-CoV-2 RNA is generally detectable in upper and lower  respiratory specimens dur ing the acute phase of infection.  Positive  results are indicative of active infection with SARS-CoV-2.  Clinical  correlation with patient history and other diagnostic information is  necessary to determine patient infection status.  Positive results do  not rule out bacterial infection or co-infection with other viruses. If result is PRESUMPTIVE POSTIVE SARS-CoV-2 nucleic acids MAY BE PRESENT.   A presumptive positive result was obtained on the submitted specimen  and confirmed on repeat testing.  While 2019 novel coronavirus  (SARS-CoV-2) nucleic acids may be present in the submitted sample  additional confirmatory testing may be necessary for epidemiological  and / or clinical management purposes  to differentiate between  SARS-CoV-2 and other Sarbecovirus currently known to infect humans.  If clinically indicated additional testing with an alternate test  methodology [phone]) is advised. The SARS-CoV-2 RNA is generally  detectable in upper and lower respiratory sp ecimens during the acute  phase of infection. The expected result is Negative. Fact Sheet for Patients:  BoilerBrush.com.cy Fact Sheet for Healthcare Providers: https://pope.com/ This test is not yet approved or cleared by the [place] FDA and has been authorized for detection and/or diagnosis of SARS-CoV-2 by FDA under an Emergency Use Authorization (EUA).  This EUA will remain in effect (meaning this test can be used) for the duration of the COVID-19 declaration under  Section 564(b)(1) of the Act, 21 U.S.C. section 360bbb-3(b)(1), unless the authorization is terminated or revoked sooner. Performed at [facility], [address], [place], [place] [address]      Time coordinating discharge:  I have spent 35 minutes face to face with the patient and on the ward discussing the patients care, assessment, plan and disposition with other care givers. >50% of the time was devoted counseling the patient about the risks and benefits of treatment/Discharge disposition and coordinating care.   SIGNED:   Dimple Nanas, MD  [facility] 10/16/2018, 11:15 AM   If 7PM-7AM, please contact night-coverage [email or web address]

## 2018-10-16 NOTE — TOC Progression Note (Signed)
Transition of Care [facility]) - Progression Note    Patient Details  Name: TAHIRIH LAIR MRN: [id number] Date of Birth: 01/27/1925  Transition of Care [facility]) CM/SW Contact  Estus Krakowski, Juliann Pulse, RN Phone Number: 10/16/2018, 10:26 AM  Clinical Narrative:   [place] [phone]  [place] my Favorites Quality of Patient Care Rating 3 out of 5 stars Patient Survey Summary Rating 5 out of [place] [phone]  [place] my Favorites Quality of Patient Care Rating 3 out of 5 stars Patient Survey Summary Rating 4 out of 5 stars [place] [phone]  Add [facility]avor[facility] Quality of Patient Care Rating 4  out of 5 stars Patient Survey Summary Rating 3 out of 5 stars [place] [phone]  [place] my Favorites Quality of Patient Care Rating 4 out of 5 stars Patient Survey Summary Rating 4 out of 5 stars [place] [phone]  [place] my Favorites Quality of Patient Care Rating 4 out of 5 stars Patient Survey Summary Rating 4 out of 5 stars [facility] [place] [phone]  Add [facility] [place] my Favorites Quality of Patient Care Rating 3  out of 5 stars Patient Survey Summary Rating 4 out of 5 stars [place] [phone]  [place] my Favorites Quality of Patient Care Rating 3 out of 5 stars Patient Survey Summary Rating 4 out of 5 stars [facility] [phone]  Add [facility] my Favorites Quality of Patient Care Rating 4 out of 5 stars [place] [phone]  [place] my Favorites Quality of Patient Care Rating 3 out of 5 stars Patient Survey Summary Rating 4 out of 5 stars [facility] [phone]  Add [facility]avor[facility] Quality of Patient Care Rating 3  out of 5 stars Patient Survey Summary Rating 3 out of 5 stars [place] [phone]  Add [facility] Quality of Patient Care Rating 3  out of 5 stars Patient Survey Summary Rating 4 out of 5 stars [place] [phone]  Add [facility] Quality of Patient Care Rating 3  out of 5 stars Patient Survey Summary Rating 3 out of [place] [phone]  [place] my Favorites Quality of Patient Care Rating 4  out of 5 stars Patient Survey Summary Rating 3 out of 5 stars [place] [phone]           Expected Discharge Plan and Services           Expected Discharge Date: 10/16/18                                     Social Determinants of Health (SDOH) Interventions    Readmission Risk Interventions No flowsheet data found.

## 2018-10-17 NOTE — Progress Notes (Signed)
Patient's IV removed.  Site WNL.  AVS reviewed with patient's daughters.  Verbalized understanding of discharge instructions, physician follow-up, medications.  Patient transported by wheelchair to main entrance at discharge.  Belongings sent with patient.  Patient stable at time of discharge.

## 2018-10-19 LAB — CULTURE, BLOOD (ROUTINE X 2)
Culture: NO GROWTH
Special Requests: ADEQUATE

## 2018-10-21 DIAGNOSIS — E1165 Type 2 diabetes mellitus with hyperglycemia: Secondary | ICD-10-CM | POA: Diagnosis not present

## 2018-10-21 DIAGNOSIS — Z794 Long term (current) use of insulin: Secondary | ICD-10-CM | POA: Diagnosis not present

## 2018-10-21 DIAGNOSIS — I10 Essential (primary) hypertension: Secondary | ICD-10-CM | POA: Diagnosis not present

## 2018-10-21 DIAGNOSIS — N390 Urinary tract infection, site not specified: Secondary | ICD-10-CM | POA: Diagnosis not present

## 2018-10-22 DIAGNOSIS — B9620 Unspecified Escherichia coli [E. coli] as the cause of diseases classified elsewhere: Secondary | ICD-10-CM | POA: Diagnosis not present

## 2018-10-22 DIAGNOSIS — Z7984 Long term (current) use of oral hypoglycemic drugs: Secondary | ICD-10-CM | POA: Diagnosis not present

## 2018-10-22 DIAGNOSIS — I10 Essential (primary) hypertension: Secondary | ICD-10-CM | POA: Diagnosis not present

## 2018-10-22 DIAGNOSIS — E119 Type 2 diabetes mellitus without complications: Secondary | ICD-10-CM | POA: Diagnosis not present

## 2018-10-22 DIAGNOSIS — N390 Urinary tract infection, site not specified: Secondary | ICD-10-CM | POA: Diagnosis not present

## 2018-10-25 DIAGNOSIS — N390 Urinary tract infection, site not specified: Secondary | ICD-10-CM | POA: Diagnosis not present

## 2018-10-25 DIAGNOSIS — I10 Essential (primary) hypertension: Secondary | ICD-10-CM | POA: Diagnosis not present

## 2018-10-25 DIAGNOSIS — E119 Type 2 diabetes mellitus without complications: Secondary | ICD-10-CM | POA: Diagnosis not present

## 2018-10-25 DIAGNOSIS — B9620 Unspecified Escherichia coli [E. coli] as the cause of diseases classified elsewhere: Secondary | ICD-10-CM | POA: Diagnosis not present

## 2018-10-25 DIAGNOSIS — Z7984 Long term (current) use of oral hypoglycemic drugs: Secondary | ICD-10-CM | POA: Diagnosis not present

## 2018-10-28 DIAGNOSIS — N390 Urinary tract infection, site not specified: Secondary | ICD-10-CM | POA: Diagnosis not present

## 2018-10-28 DIAGNOSIS — B9620 Unspecified Escherichia coli [E. coli] as the cause of diseases classified elsewhere: Secondary | ICD-10-CM | POA: Diagnosis not present

## 2018-10-28 DIAGNOSIS — Z7984 Long term (current) use of oral hypoglycemic drugs: Secondary | ICD-10-CM | POA: Diagnosis not present

## 2018-10-28 DIAGNOSIS — E119 Type 2 diabetes mellitus without complications: Secondary | ICD-10-CM | POA: Diagnosis not present

## 2018-10-28 DIAGNOSIS — I10 Essential (primary) hypertension: Secondary | ICD-10-CM | POA: Diagnosis not present

## 2018-11-15 DIAGNOSIS — Z7984 Long term (current) use of oral hypoglycemic drugs: Secondary | ICD-10-CM | POA: Diagnosis not present

## 2018-11-15 DIAGNOSIS — I10 Essential (primary) hypertension: Secondary | ICD-10-CM | POA: Diagnosis not present

## 2018-11-15 DIAGNOSIS — N390 Urinary tract infection, site not specified: Secondary | ICD-10-CM | POA: Diagnosis not present

## 2018-11-15 DIAGNOSIS — B9620 Unspecified Escherichia coli [E. coli] as the cause of diseases classified elsewhere: Secondary | ICD-10-CM | POA: Diagnosis not present

## 2018-11-15 DIAGNOSIS — E119 Type 2 diabetes mellitus without complications: Secondary | ICD-10-CM | POA: Diagnosis not present

## 2018-11-28 DIAGNOSIS — E119 Type 2 diabetes mellitus without complications: Secondary | ICD-10-CM | POA: Diagnosis not present

## 2018-11-28 DIAGNOSIS — I10 Essential (primary) hypertension: Secondary | ICD-10-CM | POA: Diagnosis not present

## 2018-11-28 DIAGNOSIS — B9620 Unspecified Escherichia coli [E. coli] as the cause of diseases classified elsewhere: Secondary | ICD-10-CM | POA: Diagnosis not present

## 2018-11-28 DIAGNOSIS — N390 Urinary tract infection, site not specified: Secondary | ICD-10-CM | POA: Diagnosis not present

## 2018-11-28 DIAGNOSIS — Z7984 Long term (current) use of oral hypoglycemic drugs: Secondary | ICD-10-CM | POA: Diagnosis not present

## 2018-12-04 ENCOUNTER — Encounter (HOSPITAL_COMMUNITY): Payer: Self-pay | Admitting: Emergency Medicine

## 2018-12-16 DIAGNOSIS — I10 Essential (primary) hypertension: Secondary | ICD-10-CM | POA: Diagnosis not present

## 2018-12-16 DIAGNOSIS — E1169 Type 2 diabetes mellitus with other specified complication: Secondary | ICD-10-CM | POA: Diagnosis not present

## 2019-03-12 ENCOUNTER — Encounter: Payer: Self-pay | Admitting: Podiatry

## 2019-03-12 ENCOUNTER — Ambulatory Visit: Payer: Medicare Other | Admitting: Podiatry

## 2019-03-12 ENCOUNTER — Other Ambulatory Visit: Payer: Self-pay

## 2019-03-12 DIAGNOSIS — M79675 Pain in left toe(s): Secondary | ICD-10-CM | POA: Diagnosis not present

## 2019-03-12 DIAGNOSIS — E119 Type 2 diabetes mellitus without complications: Secondary | ICD-10-CM

## 2019-03-12 DIAGNOSIS — B351 Tinea unguium: Secondary | ICD-10-CM | POA: Insufficient documentation

## 2019-03-12 DIAGNOSIS — M79674 Pain in right toe(s): Secondary | ICD-10-CM | POA: Diagnosis not present

## 2019-03-12 NOTE — Progress Notes (Signed)
This patient presents to the office with chief complaint of long thick nails and diabetic feet.  This patient  says there  is  no pain and discomfort in her  feet.  This patient says there are long thick painful nails.  These nails are painful walking and wearing shoes.  Patient has no history of infection or drainage from both feet.  Patient is unable to  self treat his own nails . This patient presents  to the office today for treatment of the  long nails and a foot evaluation due to history of  diabetes.  General Appearance  Alert, conversant and in no acute stress.  Vascular  Dorsalis pedis and posterior tibial  pulses are not  palpable due to severe swelling both feet. bilaterally.  Capillary return is within normal limits  bilaterally. Temperature is within normal limits  bilaterally.  Neurologic  Senn-Weinstein monofilament wire test within normal limits  bilaterally. Muscle power within normal limits bilaterally.  Nails Thick disfigured discolored nails with subungual debris  from hallux to fifth toes bilaterally. No evidence of bacterial infection or drainage bilaterally.  Orthopedic  No limitations of motion of motion feet .  No crepitus or effusions noted.  No bony pathology or digital deformities noted.  Skin  normotropic skin with no porokeratosis noted bilaterally.  No signs of infections or ulcers noted.     Onychomycosis  Diabetes with no foot complications  IE  Debride nails x 10.  A diabetic foot exam was performed and there is no evidence of any vascular or neurologic pathology.   RTC 3 months.   Gardiner Barefoot DPM

## 2019-03-17 DIAGNOSIS — E1169 Type 2 diabetes mellitus with other specified complication: Secondary | ICD-10-CM | POA: Diagnosis not present

## 2019-03-17 DIAGNOSIS — I10 Essential (primary) hypertension: Secondary | ICD-10-CM | POA: Diagnosis not present

## 2019-06-11 ENCOUNTER — Ambulatory Visit (INDEPENDENT_AMBULATORY_CARE_PROVIDER_SITE_OTHER): Payer: Medicare HMO | Admitting: Podiatry

## 2019-06-11 ENCOUNTER — Other Ambulatory Visit: Payer: Self-pay

## 2019-06-11 ENCOUNTER — Encounter: Payer: Self-pay | Admitting: Podiatry

## 2019-06-11 DIAGNOSIS — E119 Type 2 diabetes mellitus without complications: Secondary | ICD-10-CM | POA: Diagnosis not present

## 2019-06-11 DIAGNOSIS — M79675 Pain in left toe(s): Secondary | ICD-10-CM | POA: Diagnosis not present

## 2019-06-11 DIAGNOSIS — M79674 Pain in right toe(s): Secondary | ICD-10-CM | POA: Diagnosis not present

## 2019-06-11 DIAGNOSIS — B351 Tinea unguium: Secondary | ICD-10-CM | POA: Diagnosis not present

## 2019-06-11 NOTE — Progress Notes (Signed)
Complaint:  Visit Type: Patient returns to my office for continued preventative foot care services. Complaint: Patient states" my nails have grown long and thick and become painful to walk and wear shoes" Patient has been diagnosed with DM with no foot complications. The patient presents for preventative foot care services.  Podiatric Exam: Vascular: dorsalis pedis and posterior tibial pulses are not  Palpable due to swelling. bilateral. Capillary return is immediate. Temperature gradient is WNL. Skin turgor WNL  Sensorium: Normal Semmes Weinstein monofilament test. Normal tactile sensation bilaterally. Nail Exam: Pt has thick disfigured discolored nails with subungual debris noted bilateral entire nail hallux through fifth toenails Ulcer Exam: There is no evidence of ulcer or pre-ulcerative changes or infection. Orthopedic Exam: Muscle tone and strength are WNL. No limitations in general ROM. No crepitus or effusions noted. Foot type and digits show no abnormalities. Bony prominences are unremarkable. Skin: No Porokeratosis. No infection or ulcers  Diagnosis:  Onychomycosis, , Pain in right toe, pain in left toes  Treatment & Plan Procedures and Treatment: Consent by patient was obtained for treatment procedures.   Debridement of mycotic and hypertrophic toenails, 1 through 5 bilateral and clearing of subungual debris. No ulceration, no infection noted.  Return Visit-Office Procedure: Patient instructed to return to the office for a follow up visit 3 months for continued evaluation and treatment.    Helane Gunther DPM

## 2019-06-12 DIAGNOSIS — I10 Essential (primary) hypertension: Secondary | ICD-10-CM | POA: Diagnosis not present

## 2019-06-17 DIAGNOSIS — I10 Essential (primary) hypertension: Secondary | ICD-10-CM | POA: Diagnosis not present

## 2019-06-17 DIAGNOSIS — E1169 Type 2 diabetes mellitus with other specified complication: Secondary | ICD-10-CM | POA: Diagnosis not present

## 2019-09-09 ENCOUNTER — Other Ambulatory Visit: Payer: Self-pay

## 2019-09-09 ENCOUNTER — Ambulatory Visit (INDEPENDENT_AMBULATORY_CARE_PROVIDER_SITE_OTHER): Payer: Medicare HMO | Admitting: Podiatry

## 2019-09-09 ENCOUNTER — Encounter: Payer: Self-pay | Admitting: Podiatry

## 2019-09-09 VITALS — Temp 97.4°F

## 2019-09-09 DIAGNOSIS — N179 Acute kidney failure, unspecified: Secondary | ICD-10-CM

## 2019-09-09 DIAGNOSIS — B351 Tinea unguium: Secondary | ICD-10-CM

## 2019-09-09 DIAGNOSIS — M79674 Pain in right toe(s): Secondary | ICD-10-CM

## 2019-09-09 DIAGNOSIS — M79675 Pain in left toe(s): Secondary | ICD-10-CM

## 2019-09-09 DIAGNOSIS — E119 Type 2 diabetes mellitus without complications: Secondary | ICD-10-CM

## 2019-09-09 NOTE — Progress Notes (Signed)
This patient returns to my office for at risk foot care.  This patient requires this care by a professional since this patient will be at risk due to having diabetes and kidney injury.  This patient is unable to cut nails herself since the patient cannot reach her nails.These nails are painful walking and wearing shoes.  This patient presents for at risk foot care today.  General Appearance  Alert, conversant and in no acute stress.  Vascular  Dorsalis pedis and posterior tibial  pulses are not  palpable  Bilaterally  Due to swelling..  Capillary return is within normal limits  bilaterally. Temperature is within normal limits  bilaterally.  Neurologic  Senn-Weinstein monofilament wire test within normal limits  bilaterally. Muscle power within normal limits bilaterally.  Nails Thick disfigured discolored nails with subungual debris  Hallux nails  B/L.. No evidence of bacterial infection or drainage bilaterally.  Orthopedic  No limitations of motion  feet .  No crepitus or effusions noted.  No bony pathology or digital deformities noted.  Skin  normotropic skin with no porokeratosis noted bilaterally.  No signs of infections or ulcers noted.     Onychomycosis  Pain in right toes  Pain in left toes  Consent was obtained for treatment procedures.   Mechanical debridement of hallux nails   bilaterally performed with a nail nipper.  Filed with dremel without incident.    Return office visit  3 months                    Told patient to return for periodic foot care and evaluation due to potential at risk complications.   Jazsmine Macari DPM  

## 2019-12-10 ENCOUNTER — Ambulatory Visit (INDEPENDENT_AMBULATORY_CARE_PROVIDER_SITE_OTHER): Payer: Medicare HMO | Admitting: Podiatry

## 2019-12-10 ENCOUNTER — Encounter: Payer: Self-pay | Admitting: Podiatry

## 2019-12-10 ENCOUNTER — Other Ambulatory Visit: Payer: Self-pay

## 2019-12-10 DIAGNOSIS — M79674 Pain in right toe(s): Secondary | ICD-10-CM

## 2019-12-10 DIAGNOSIS — M79675 Pain in left toe(s): Secondary | ICD-10-CM

## 2019-12-10 DIAGNOSIS — E119 Type 2 diabetes mellitus without complications: Secondary | ICD-10-CM

## 2019-12-10 DIAGNOSIS — N179 Acute kidney failure, unspecified: Secondary | ICD-10-CM

## 2019-12-10 DIAGNOSIS — B351 Tinea unguium: Secondary | ICD-10-CM | POA: Diagnosis not present

## 2019-12-10 NOTE — Progress Notes (Signed)
This patient returns to my office for at risk foot care.  This patient requires this care by a professional since this patient will be at risk due to having diabetes and kidney injury.  This patient is unable to cut nails herself since the patient cannot reach her nails.These nails are painful walking and wearing shoes.  This patient presents for at risk foot care today.  General Appearance  Alert, conversant and in no acute stress.  Vascular  Dorsalis pedis and posterior tibial  pulses are not  palpable  Bilaterally  Due to swelling..  Capillary return is within normal limits  bilaterally. Temperature is within normal limits  bilaterally.  Neurologic  Senn-Weinstein monofilament wire test within normal limits  bilaterally. Muscle power within normal limits bilaterally.  Nails Thick disfigured discolored nails with subungual debris  Hallux nails  B/L.. No evidence of bacterial infection or drainage bilaterally.  Orthopedic  No limitations of motion  feet .  No crepitus or effusions noted.  No bony pathology or digital deformities noted.  Skin  normotropic skin with no porokeratosis noted bilaterally.  No signs of infections or ulcers noted.     Onychomycosis  Pain in right toes  Pain in left toes  Consent was obtained for treatment procedures.   Mechanical debridement of hallux nails   bilaterally performed with a nail nipper.  Filed with dremel without incident.    Return office visit  3 months                    Told patient to return for periodic foot care and evaluation due to potential at risk complications.   Earmon Sherrow DPM  

## 2019-12-23 DIAGNOSIS — N183 Chronic kidney disease, stage 3 unspecified: Secondary | ICD-10-CM | POA: Diagnosis not present

## 2019-12-23 DIAGNOSIS — E1122 Type 2 diabetes mellitus with diabetic chronic kidney disease: Secondary | ICD-10-CM | POA: Diagnosis not present

## 2019-12-23 DIAGNOSIS — E1165 Type 2 diabetes mellitus with hyperglycemia: Secondary | ICD-10-CM | POA: Diagnosis not present

## 2019-12-23 DIAGNOSIS — I10 Essential (primary) hypertension: Secondary | ICD-10-CM | POA: Diagnosis not present

## 2019-12-23 DIAGNOSIS — Z794 Long term (current) use of insulin: Secondary | ICD-10-CM | POA: Diagnosis not present

## 2019-12-23 DIAGNOSIS — R600 Localized edema: Secondary | ICD-10-CM | POA: Diagnosis not present

## 2019-12-29 DIAGNOSIS — Z0100 Encounter for examination of eyes and vision without abnormal findings: Secondary | ICD-10-CM | POA: Diagnosis not present

## 2020-01-22 DIAGNOSIS — I10 Essential (primary) hypertension: Secondary | ICD-10-CM | POA: Diagnosis not present

## 2020-01-22 DIAGNOSIS — E1165 Type 2 diabetes mellitus with hyperglycemia: Secondary | ICD-10-CM | POA: Diagnosis not present

## 2020-01-22 DIAGNOSIS — Z794 Long term (current) use of insulin: Secondary | ICD-10-CM | POA: Diagnosis not present

## 2020-02-21 DIAGNOSIS — I10 Essential (primary) hypertension: Secondary | ICD-10-CM | POA: Diagnosis not present

## 2020-02-21 DIAGNOSIS — Z794 Long term (current) use of insulin: Secondary | ICD-10-CM | POA: Diagnosis not present

## 2020-02-21 DIAGNOSIS — E1165 Type 2 diabetes mellitus with hyperglycemia: Secondary | ICD-10-CM | POA: Diagnosis not present

## 2020-03-04 DIAGNOSIS — R69 Illness, unspecified: Secondary | ICD-10-CM | POA: Diagnosis not present

## 2020-03-17 ENCOUNTER — Encounter: Payer: Self-pay | Admitting: Podiatry

## 2020-03-17 ENCOUNTER — Other Ambulatory Visit: Payer: Self-pay

## 2020-03-17 ENCOUNTER — Ambulatory Visit: Payer: Medicare HMO | Admitting: Podiatry

## 2020-03-17 DIAGNOSIS — E119 Type 2 diabetes mellitus without complications: Secondary | ICD-10-CM

## 2020-03-17 DIAGNOSIS — M79675 Pain in left toe(s): Secondary | ICD-10-CM

## 2020-03-17 DIAGNOSIS — B351 Tinea unguium: Secondary | ICD-10-CM

## 2020-03-17 DIAGNOSIS — M79674 Pain in right toe(s): Secondary | ICD-10-CM

## 2020-03-17 NOTE — Progress Notes (Signed)
This patient returns to my office for at risk foot care.  This patient requires this care by a professional since this patient will be at risk due to having diabetes and kidney injury.  This patient is unable to cut nails herself since the patient cannot reach her nails.These nails are painful walking and wearing shoes.  This patient presents for at risk foot care today.  General Appearance  Alert, conversant and in no acute stress.  Vascular  Dorsalis pedis and posterior tibial  pulses are not  palpable  Bilaterally  Due to swelling..  Capillary return is within normal limits  bilaterally. Temperature is within normal limits  bilaterally.  Neurologic  Senn-Weinstein monofilament wire test within normal limits  bilaterally. Muscle power within normal limits bilaterally.  Nails Thick disfigured discolored nails with subungual debris  Hallux nails  B/L.. No evidence of bacterial infection or drainage bilaterally.  Orthopedic  No limitations of motion  feet .  No crepitus or effusions noted.  No bony pathology or digital deformities noted.  Skin  normotropic skin with no porokeratosis noted bilaterally.  No signs of infections or ulcers noted.     Onychomycosis  Pain in right toes  Pain in left toes  Consent was obtained for treatment procedures.   Mechanical debridement of hallux nails   bilaterally performed with a nail nipper.  Filed with dremel without incident.    Return office visit  3 months                    Told patient to return for periodic foot care and evaluation due to potential at risk complications.   Keller Mikels DPM  

## 2020-03-23 DIAGNOSIS — E1165 Type 2 diabetes mellitus with hyperglycemia: Secondary | ICD-10-CM | POA: Diagnosis not present

## 2020-03-23 DIAGNOSIS — Z794 Long term (current) use of insulin: Secondary | ICD-10-CM | POA: Diagnosis not present

## 2020-03-23 DIAGNOSIS — I10 Essential (primary) hypertension: Secondary | ICD-10-CM | POA: Diagnosis not present

## 2020-03-29 DIAGNOSIS — E1122 Type 2 diabetes mellitus with diabetic chronic kidney disease: Secondary | ICD-10-CM | POA: Diagnosis not present

## 2020-03-29 DIAGNOSIS — N183 Chronic kidney disease, stage 3 unspecified: Secondary | ICD-10-CM | POA: Diagnosis not present

## 2020-03-29 DIAGNOSIS — I10 Essential (primary) hypertension: Secondary | ICD-10-CM | POA: Diagnosis not present

## 2020-03-29 DIAGNOSIS — E1169 Type 2 diabetes mellitus with other specified complication: Secondary | ICD-10-CM | POA: Diagnosis not present

## 2020-04-23 DIAGNOSIS — Z794 Long term (current) use of insulin: Secondary | ICD-10-CM | POA: Diagnosis not present

## 2020-04-23 DIAGNOSIS — E1165 Type 2 diabetes mellitus with hyperglycemia: Secondary | ICD-10-CM | POA: Diagnosis not present

## 2020-04-23 DIAGNOSIS — I10 Essential (primary) hypertension: Secondary | ICD-10-CM | POA: Diagnosis not present

## 2020-05-22 DIAGNOSIS — Z794 Long term (current) use of insulin: Secondary | ICD-10-CM | POA: Diagnosis not present

## 2020-05-22 DIAGNOSIS — E1165 Type 2 diabetes mellitus with hyperglycemia: Secondary | ICD-10-CM | POA: Diagnosis not present

## 2020-05-22 DIAGNOSIS — I10 Essential (primary) hypertension: Secondary | ICD-10-CM | POA: Diagnosis not present

## 2020-06-21 DIAGNOSIS — E1165 Type 2 diabetes mellitus with hyperglycemia: Secondary | ICD-10-CM | POA: Diagnosis not present

## 2020-06-21 DIAGNOSIS — I10 Essential (primary) hypertension: Secondary | ICD-10-CM | POA: Diagnosis not present

## 2020-06-23 ENCOUNTER — Ambulatory Visit: Payer: Medicare HMO | Admitting: Podiatry

## 2020-06-28 DIAGNOSIS — I872 Venous insufficiency (chronic) (peripheral): Secondary | ICD-10-CM | POA: Diagnosis not present

## 2020-06-28 DIAGNOSIS — I10 Essential (primary) hypertension: Secondary | ICD-10-CM | POA: Diagnosis not present

## 2020-06-28 DIAGNOSIS — E1122 Type 2 diabetes mellitus with diabetic chronic kidney disease: Secondary | ICD-10-CM | POA: Diagnosis not present

## 2020-06-28 DIAGNOSIS — N182 Chronic kidney disease, stage 2 (mild): Secondary | ICD-10-CM | POA: Diagnosis not present

## 2020-06-28 DIAGNOSIS — R600 Localized edema: Secondary | ICD-10-CM | POA: Diagnosis not present

## 2020-06-28 DIAGNOSIS — Z0001 Encounter for general adult medical examination with abnormal findings: Secondary | ICD-10-CM | POA: Diagnosis not present

## 2020-07-22 DIAGNOSIS — I10 Essential (primary) hypertension: Secondary | ICD-10-CM | POA: Diagnosis not present

## 2020-07-22 DIAGNOSIS — E1165 Type 2 diabetes mellitus with hyperglycemia: Secondary | ICD-10-CM | POA: Diagnosis not present

## 2020-07-22 DIAGNOSIS — Z794 Long term (current) use of insulin: Secondary | ICD-10-CM | POA: Diagnosis not present

## 2020-08-21 DIAGNOSIS — I10 Essential (primary) hypertension: Secondary | ICD-10-CM | POA: Diagnosis not present

## 2020-08-21 DIAGNOSIS — E1165 Type 2 diabetes mellitus with hyperglycemia: Secondary | ICD-10-CM | POA: Diagnosis not present

## 2020-08-21 DIAGNOSIS — Z794 Long term (current) use of insulin: Secondary | ICD-10-CM | POA: Diagnosis not present

## 2020-08-25 DIAGNOSIS — Z1231 Encounter for screening mammogram for malignant neoplasm of breast: Secondary | ICD-10-CM | POA: Diagnosis not present

## 2020-09-21 DIAGNOSIS — I10 Essential (primary) hypertension: Secondary | ICD-10-CM | POA: Diagnosis not present

## 2020-09-21 DIAGNOSIS — E1165 Type 2 diabetes mellitus with hyperglycemia: Secondary | ICD-10-CM | POA: Diagnosis not present

## 2020-09-21 DIAGNOSIS — Z794 Long term (current) use of insulin: Secondary | ICD-10-CM | POA: Diagnosis not present

## 2020-09-27 DIAGNOSIS — I10 Essential (primary) hypertension: Secondary | ICD-10-CM | POA: Diagnosis not present

## 2020-09-27 DIAGNOSIS — E1169 Type 2 diabetes mellitus with other specified complication: Secondary | ICD-10-CM | POA: Diagnosis not present

## 2020-09-29 ENCOUNTER — Encounter: Payer: Self-pay | Admitting: Podiatry

## 2020-09-29 ENCOUNTER — Other Ambulatory Visit: Payer: Self-pay

## 2020-09-29 ENCOUNTER — Ambulatory Visit: Payer: Medicare HMO | Admitting: Podiatry

## 2020-09-29 DIAGNOSIS — M79674 Pain in right toe(s): Secondary | ICD-10-CM

## 2020-09-29 DIAGNOSIS — N179 Acute kidney failure, unspecified: Secondary | ICD-10-CM | POA: Diagnosis not present

## 2020-09-29 DIAGNOSIS — E119 Type 2 diabetes mellitus without complications: Secondary | ICD-10-CM

## 2020-09-29 DIAGNOSIS — E1169 Type 2 diabetes mellitus with other specified complication: Secondary | ICD-10-CM | POA: Diagnosis not present

## 2020-09-29 DIAGNOSIS — B351 Tinea unguium: Secondary | ICD-10-CM | POA: Diagnosis not present

## 2020-09-29 DIAGNOSIS — M79675 Pain in left toe(s): Secondary | ICD-10-CM | POA: Diagnosis not present

## 2020-09-29 DIAGNOSIS — I10 Essential (primary) hypertension: Secondary | ICD-10-CM | POA: Diagnosis not present

## 2020-09-29 NOTE — Progress Notes (Signed)
This patient returns to my office for at risk foot care.  This patient requires this care by a professional since this patient will be at risk due to having diabetes and kidney injury.  This patient is unable to cut nails herself since the patient cannot reach her nails.These nails are painful walking and wearing shoes.  This patient presents for at risk foot care today.  General Appearance  Alert, conversant and in no acute stress.  Vascular  Dorsalis pedis and posterior tibial  pulses are not  palpable  Bilaterally  Due to swelling..  Capillary return is within normal limits  bilaterally. Temperature is within normal limits  bilaterally.  Neurologic  Senn-Weinstein monofilament wire test within normal limits  bilaterally. Muscle power within normal limits bilaterally.  Nails Thick disfigured discolored nails with subungual debris  Hallux nails  B/L.[place] Kitchen No evidence of bacterial infection or drainage bilaterally.  Orthopedic  No limitations of motion  feet .  No crepitus or effusions noted.  No bony pathology or digital deformities noted.  Skin  normotropic skin with no porokeratosis noted bilaterally.  No signs of infections or ulcers noted.     Onychomycosis  Pain in right toes  Pain in left toes  Consent was obtained for treatment procedures.   Mechanical debridement of hallux nails   bilaterally performed with a nail nipper.  Filed with dremel without incident.    Return office visit  3 months                    Told patient to return for periodic foot care and evaluation due to potential at risk complications.   Helane Gunther DPM

## 2020-10-21 DIAGNOSIS — E1165 Type 2 diabetes mellitus with hyperglycemia: Secondary | ICD-10-CM | POA: Diagnosis not present

## 2020-10-21 DIAGNOSIS — I10 Essential (primary) hypertension: Secondary | ICD-10-CM | POA: Diagnosis not present

## 2020-10-21 DIAGNOSIS — Z794 Long term (current) use of insulin: Secondary | ICD-10-CM | POA: Diagnosis not present

## 2020-11-21 DIAGNOSIS — Z794 Long term (current) use of insulin: Secondary | ICD-10-CM | POA: Diagnosis not present

## 2020-11-21 DIAGNOSIS — E1165 Type 2 diabetes mellitus with hyperglycemia: Secondary | ICD-10-CM | POA: Diagnosis not present

## 2020-11-21 DIAGNOSIS — I10 Essential (primary) hypertension: Secondary | ICD-10-CM | POA: Diagnosis not present

## 2020-12-22 DIAGNOSIS — E1165 Type 2 diabetes mellitus with hyperglycemia: Secondary | ICD-10-CM | POA: Diagnosis not present

## 2020-12-22 DIAGNOSIS — I10 Essential (primary) hypertension: Secondary | ICD-10-CM | POA: Diagnosis not present

## 2020-12-22 DIAGNOSIS — Z794 Long term (current) use of insulin: Secondary | ICD-10-CM | POA: Diagnosis not present

## 2020-12-28 DIAGNOSIS — R600 Localized edema: Secondary | ICD-10-CM | POA: Diagnosis not present

## 2020-12-28 DIAGNOSIS — I872 Venous insufficiency (chronic) (peripheral): Secondary | ICD-10-CM | POA: Diagnosis not present

## 2020-12-28 DIAGNOSIS — E1169 Type 2 diabetes mellitus with other specified complication: Secondary | ICD-10-CM | POA: Diagnosis not present

## 2020-12-28 DIAGNOSIS — I10 Essential (primary) hypertension: Secondary | ICD-10-CM | POA: Diagnosis not present

## 2021-01-05 ENCOUNTER — Other Ambulatory Visit: Payer: Self-pay

## 2021-01-05 ENCOUNTER — Ambulatory Visit (INDEPENDENT_AMBULATORY_CARE_PROVIDER_SITE_OTHER): Payer: Medicare HMO | Admitting: Podiatry

## 2021-01-05 ENCOUNTER — Encounter: Payer: Self-pay | Admitting: Podiatry

## 2021-01-05 DIAGNOSIS — M79675 Pain in left toe(s): Secondary | ICD-10-CM

## 2021-01-05 DIAGNOSIS — N179 Acute kidney failure, unspecified: Secondary | ICD-10-CM

## 2021-01-05 DIAGNOSIS — B351 Tinea unguium: Secondary | ICD-10-CM

## 2021-01-05 DIAGNOSIS — M79674 Pain in right toe(s): Secondary | ICD-10-CM

## 2021-01-05 DIAGNOSIS — E119 Type 2 diabetes mellitus without complications: Secondary | ICD-10-CM

## 2021-01-05 NOTE — Progress Notes (Signed)
This patient returns to my office for at risk foot care.  This patient requires this care by a professional since this patient will be at risk due to having diabetes and kidney injury.  This patient is unable to cut nails herself since the patient cannot reach her nails.These nails are painful walking and wearing shoes.  This patient presents for at risk foot care today.  General Appearance  Alert, conversant and in no acute stress.  Vascular  Dorsalis pedis and posterior tibial  pulses are not  palpable  Bilaterally  Due to swelling..  Capillary return is within normal limits  bilaterally. Temperature is within normal limits  bilaterally.  Neurologic  Senn-Weinstein monofilament wire test within normal limits  bilaterally. Muscle power within normal limits bilaterally.  Nails Thick disfigured discolored nails with subungual debris  Hallux nails  B/L.. No evidence of bacterial infection or drainage bilaterally.  Orthopedic  No limitations of motion  feet .  No crepitus or effusions noted.  No bony pathology or digital deformities noted.  Skin  normotropic skin with no porokeratosis noted bilaterally.  No signs of infections or ulcers noted.     Onychomycosis  Pain in right toes  Pain in left toes  Consent was obtained for treatment procedures.   Mechanical debridement of hallux nails   bilaterally performed with a nail nipper.  Filed with dremel without incident.    Return office visit  3 months                    Told patient to return for periodic foot care and evaluation due to potential at risk complications.   Nefertari Rebman DPM  

## 2021-01-21 DIAGNOSIS — I10 Essential (primary) hypertension: Secondary | ICD-10-CM | POA: Diagnosis not present

## 2021-01-21 DIAGNOSIS — E1165 Type 2 diabetes mellitus with hyperglycemia: Secondary | ICD-10-CM | POA: Diagnosis not present

## 2021-01-21 DIAGNOSIS — Z794 Long term (current) use of insulin: Secondary | ICD-10-CM | POA: Diagnosis not present

## 2021-01-25 DIAGNOSIS — Z8249 Family history of ischemic heart disease and other diseases of the circulatory system: Secondary | ICD-10-CM | POA: Diagnosis not present

## 2021-01-25 DIAGNOSIS — Z7984 Long term (current) use of oral hypoglycemic drugs: Secondary | ICD-10-CM | POA: Diagnosis not present

## 2021-01-25 DIAGNOSIS — Z833 Family history of diabetes mellitus: Secondary | ICD-10-CM | POA: Diagnosis not present

## 2021-01-25 DIAGNOSIS — I10 Essential (primary) hypertension: Secondary | ICD-10-CM | POA: Diagnosis not present

## 2021-01-25 DIAGNOSIS — E119 Type 2 diabetes mellitus without complications: Secondary | ICD-10-CM | POA: Diagnosis not present

## 2021-02-21 DIAGNOSIS — I10 Essential (primary) hypertension: Secondary | ICD-10-CM | POA: Diagnosis not present

## 2021-02-21 DIAGNOSIS — E1165 Type 2 diabetes mellitus with hyperglycemia: Secondary | ICD-10-CM | POA: Diagnosis not present

## 2021-02-21 DIAGNOSIS — Z794 Long term (current) use of insulin: Secondary | ICD-10-CM | POA: Diagnosis not present

## 2021-03-23 DIAGNOSIS — E1165 Type 2 diabetes mellitus with hyperglycemia: Secondary | ICD-10-CM | POA: Diagnosis not present

## 2021-03-23 DIAGNOSIS — I10 Essential (primary) hypertension: Secondary | ICD-10-CM | POA: Diagnosis not present

## 2021-03-23 DIAGNOSIS — Z794 Long term (current) use of insulin: Secondary | ICD-10-CM | POA: Diagnosis not present

## 2021-03-29 DIAGNOSIS — E1169 Type 2 diabetes mellitus with other specified complication: Secondary | ICD-10-CM | POA: Diagnosis not present

## 2021-03-29 DIAGNOSIS — Z23 Encounter for immunization: Secondary | ICD-10-CM | POA: Diagnosis not present

## 2021-03-29 DIAGNOSIS — R69 Illness, unspecified: Secondary | ICD-10-CM | POA: Diagnosis not present

## 2021-03-29 DIAGNOSIS — I10 Essential (primary) hypertension: Secondary | ICD-10-CM | POA: Diagnosis not present

## 2021-04-05 ENCOUNTER — Ambulatory Visit: Payer: Medicare HMO | Admitting: Podiatry

## 2021-05-10 ENCOUNTER — Other Ambulatory Visit: Payer: Self-pay

## 2021-05-10 ENCOUNTER — Ambulatory Visit: Payer: Medicare HMO | Admitting: Podiatry

## 2021-05-10 ENCOUNTER — Encounter: Payer: Self-pay | Admitting: Podiatry

## 2021-05-10 ENCOUNTER — Ambulatory Visit: Payer: Medicare HMO

## 2021-05-10 DIAGNOSIS — M2140 Flat foot [pes planus] (acquired), unspecified foot: Secondary | ICD-10-CM | POA: Insufficient documentation

## 2021-05-10 DIAGNOSIS — M2141 Flat foot [pes planus] (acquired), right foot: Secondary | ICD-10-CM

## 2021-05-10 DIAGNOSIS — N179 Acute kidney failure, unspecified: Secondary | ICD-10-CM | POA: Diagnosis not present

## 2021-05-10 DIAGNOSIS — B351 Tinea unguium: Secondary | ICD-10-CM

## 2021-05-10 DIAGNOSIS — M79675 Pain in left toe(s): Secondary | ICD-10-CM

## 2021-05-10 DIAGNOSIS — M79674 Pain in right toe(s): Secondary | ICD-10-CM

## 2021-05-10 DIAGNOSIS — E119 Type 2 diabetes mellitus without complications: Secondary | ICD-10-CM | POA: Diagnosis not present

## 2021-05-10 DIAGNOSIS — M2142 Flat foot [pes planus] (acquired), left foot: Secondary | ICD-10-CM

## 2021-05-10 NOTE — Progress Notes (Addendum)
This patient returns to my office for at risk foot care.  This patient requires this care by a professional since this patient will be at risk due to having diabetes and kidney injury.  This patient is unable to cut nails herself since the patient cannot reach her nails.These nails are painful walking and wearing shoes.  This patient presents for at risk foot care today.  General Appearance  Alert, conversant and in no acute stress.  Vascular  Dorsalis pedis and posterior tibial  pulses are not  palpable  Bilaterally  Due to swelling..  Capillary return is within normal limits  bilaterally. Temperature is within normal limits  bilaterally.  Neurologic  Senn-Weinstein monofilament wire test within normal limits  bilaterally. Muscle power within normal limits bilaterally.  Nails Thick disfigured discolored nails with subungual debris  Hallux nails  B/L.. No evidence of bacterial infection or drainage bilaterally.  Orthopedic  No limitations of motion  feet .  No crepitus or effusions noted.  No bony pathology or digital deformities noted.  Pes planus.  Skin  normotropic skin with no porokeratosis noted bilaterally.  No signs of infections or ulcers noted.     Onychomycosis  Pain in right toes  Pain in left toes  Consent was obtained for treatment procedures.   Mechanical debridement of hallux nails   bilaterally performed with a nail nipper.  Filed with dremel without incident. Patient qualifies for diabetic shoes due to diabetes with vascular pathology,  lymphedema and pes planus.   Return office visit  3 months                    Told patient to return for periodic foot care and evaluation due to potential at risk complications.   Deshaun Schou DPM  

## 2021-05-10 NOTE — Progress Notes (Signed)
SITUATION Reason for Consult: Evaluation for Prefabricated Diabetic Shoes and Bilateral Custom Diabetic Inserts. Patient / Caregiver Report: Patient would like well fitting shoes  OBJECTIVE DATA: Patient History / Diagnosis:    ICD-10-CM   1. Diabetes mellitus without complication (HCC)  E11.9     2. Pes planus of both feet  M21.41    M21.42       Current or Previous Devices:   None and no history  In-Person Foot Examination: Ulcers & Callousing:   None  Toe / Foot Deformities:   - Pes Planus    Shoe Size: 21M  ORTHOTIC RECOMMENDATION Recommended Devices: - 1x pair prefabricated PDAC approved diabetic shoes: A3200W 21M - 3x pair custom-to-patient vacuum formed diabetic insoles.   GOALS OF SHOES AND INSOLES - Reduce shear and pressure - Reduce / Prevent callus formation - Reduce / Prevent ulceration - Protect the fragile healing compromised diabetic foot.  Patient would benefit from diabetic shoes and inserts as patient has diabetes mellitus and the patient has one or more of the following conditions: - Peripheral neuropathy with evidence of callus formation - Foot deformity - Poor circulation  ACTIONS PERFORMED Patient was casted for insoles via crush box and measured for shoes via brannock device. Procedure was explained and patient tolerated procedure well. All questions were answered and concerns addressed.  PLAN Patient is to ensure treating physician receives and completes diabetic paperwork. Casts and shoe order are to be held until paperwork is received. Once received patient is to be scheduled for fitting in four weeks.

## 2021-05-22 DIAGNOSIS — Z794 Long term (current) use of insulin: Secondary | ICD-10-CM | POA: Diagnosis not present

## 2021-05-22 DIAGNOSIS — E1165 Type 2 diabetes mellitus with hyperglycemia: Secondary | ICD-10-CM | POA: Diagnosis not present

## 2021-05-22 DIAGNOSIS — I10 Essential (primary) hypertension: Secondary | ICD-10-CM | POA: Diagnosis not present

## 2021-06-21 DIAGNOSIS — I10 Essential (primary) hypertension: Secondary | ICD-10-CM | POA: Diagnosis not present

## 2021-06-21 DIAGNOSIS — E1165 Type 2 diabetes mellitus with hyperglycemia: Secondary | ICD-10-CM | POA: Diagnosis not present

## 2021-06-21 DIAGNOSIS — Z794 Long term (current) use of insulin: Secondary | ICD-10-CM | POA: Diagnosis not present

## 2021-06-28 DIAGNOSIS — I872 Venous insufficiency (chronic) (peripheral): Secondary | ICD-10-CM | POA: Diagnosis not present

## 2021-06-28 DIAGNOSIS — Z6827 Body mass index (BMI) 27.0-27.9, adult: Secondary | ICD-10-CM | POA: Diagnosis not present

## 2021-06-28 DIAGNOSIS — R69 Illness, unspecified: Secondary | ICD-10-CM | POA: Diagnosis not present

## 2021-06-28 DIAGNOSIS — I10 Essential (primary) hypertension: Secondary | ICD-10-CM | POA: Diagnosis not present

## 2021-06-28 DIAGNOSIS — E1169 Type 2 diabetes mellitus with other specified complication: Secondary | ICD-10-CM | POA: Diagnosis not present

## 2021-06-28 DIAGNOSIS — E785 Hyperlipidemia, unspecified: Secondary | ICD-10-CM | POA: Diagnosis not present

## 2021-07-18 ENCOUNTER — Other Ambulatory Visit: Payer: Self-pay

## 2021-07-18 ENCOUNTER — Ambulatory Visit (INDEPENDENT_AMBULATORY_CARE_PROVIDER_SITE_OTHER): Payer: Medicare HMO

## 2021-07-18 DIAGNOSIS — M2141 Flat foot [pes planus] (acquired), right foot: Secondary | ICD-10-CM

## 2021-07-18 DIAGNOSIS — M2142 Flat foot [pes planus] (acquired), left foot: Secondary | ICD-10-CM

## 2021-07-18 DIAGNOSIS — E119 Type 2 diabetes mellitus without complications: Secondary | ICD-10-CM

## 2021-07-18 NOTE — Progress Notes (Signed)
SITUATION ?Reason for Visit: Fitting of Diabetic Shoes & Insoles ?Patient / Caregiver Report:  Patient is satisfied with fit and function of shoes and insoles. ? ?OBJECTIVE DATA: ?Patient History / Diagnosis:   ?  ICD-10-CM   ?1. Diabetes mellitus without complication (HCC)  E11.9   ?  ?2. Pes planus of both feet  M21.41   ? M21.42   ?  ? ? ?Change in Status:   None ? ?ACTIONS PERFORMED: ?In-Person Delivery, patient was fit with: ?- 1x pair A5500 PDAC approved prefabricated Diabetic Shoes: Apex A3200W 62M ?- 3x pair A5513 PDAC approved vacuum formed custom diabetic insoles; [employer]: [id number] ? ?Shoes and insoles were verified for structural integrity and safety. Patient wore shoes and insoles in office. Skin was inspected and free of areas of concern after wearing shoes and inserts. Shoes and inserts fit properly. Patient / Caregiver provided with ferbal instruction and demonstration regarding donning, doffing, wear, care, proper fit, function, purpose, cleaning, and use of shoes and insoles ' and in all related precautions and risks and benefits regarding shoes and insoles. Patient / Caregiver was instructed to wear properly fitting socks with shoes at all times. Patient was also provided with verbal instruction regarding how to report any failures or malfunctions of shoes or inserts, and necessary follow up care. Patient / Caregiver was also instructed to contact physician regarding change in status that may affect function of shoes and inserts.  ? ?Patient / Caregiver verbalized undersatnding of instruction provided. Patient / Caregiver demonstrated independence with proper donning and doffing of shoes and inserts. ? ?PLAN ?Patient to follow with treating physician as recommended. Plan of care was discussed with and agreed upon by patient and/or caregiver. All questions were answered and concerns addressed. ? ?

## 2021-07-22 DIAGNOSIS — E1165 Type 2 diabetes mellitus with hyperglycemia: Secondary | ICD-10-CM | POA: Diagnosis not present

## 2021-07-22 DIAGNOSIS — I10 Essential (primary) hypertension: Secondary | ICD-10-CM | POA: Diagnosis not present

## 2021-08-08 ENCOUNTER — Ambulatory Visit: Payer: Medicare HMO | Admitting: Podiatry

## 2021-08-08 ENCOUNTER — Encounter: Payer: Self-pay | Admitting: Podiatry

## 2021-08-08 DIAGNOSIS — M79675 Pain in left toe(s): Secondary | ICD-10-CM | POA: Diagnosis not present

## 2021-08-08 DIAGNOSIS — B351 Tinea unguium: Secondary | ICD-10-CM | POA: Diagnosis not present

## 2021-08-08 DIAGNOSIS — M2141 Flat foot [pes planus] (acquired), right foot: Secondary | ICD-10-CM

## 2021-08-08 DIAGNOSIS — E119 Type 2 diabetes mellitus without complications: Secondary | ICD-10-CM | POA: Diagnosis not present

## 2021-08-08 DIAGNOSIS — M79674 Pain in right toe(s): Secondary | ICD-10-CM

## 2021-08-08 DIAGNOSIS — M2142 Flat foot [pes planus] (acquired), left foot: Secondary | ICD-10-CM | POA: Diagnosis not present

## 2021-08-08 NOTE — Progress Notes (Signed)
This patient returns to my office for at risk foot care.  This patient requires this care by a professional since this patient will be at risk due to having diabetes and kidney injury.  This patient is unable to cut nails herself since the patient cannot reach her nails.These nails are painful walking and wearing shoes.  This patient presents for at risk foot care today.  General Appearance  Alert, conversant and in no acute stress.  Vascular  Dorsalis pedis and posterior tibial  pulses are not  palpable  Bilaterally  Due to swelling..  Capillary return is within normal limits  bilaterally. Temperature is within normal limits  bilaterally.  Neurologic  Senn-Weinstein monofilament wire test within normal limits  bilaterally. Muscle power within normal limits bilaterally.  Nails Thick disfigured discolored nails with subungual debris  Hallux nails  B/L.. No evidence of bacterial infection or drainage bilaterally.  Orthopedic  No limitations of motion  feet .  No crepitus or effusions noted.  No bony pathology or digital deformities noted.  Pes planus.  Skin  normotropic skin with no porokeratosis noted bilaterally.  No signs of infections or ulcers noted.     Onychomycosis  Pain in right toes  Pain in left toes  Consent was obtained for treatment procedures.   Mechanical debridement of hallux nails   bilaterally performed with a nail nipper.  Filed with dremel without incident. Patient qualifies for diabetic shoes due to diabetes with vascular pathology,  lymphedema and pes planus.   Return office visit  3 months                    Told patient to return for periodic foot care and evaluation due to potential at risk complications.   Tajana Crotteau DPM  

## 2021-08-10 DIAGNOSIS — E119 Type 2 diabetes mellitus without complications: Secondary | ICD-10-CM | POA: Diagnosis not present

## 2021-08-21 DIAGNOSIS — E1165 Type 2 diabetes mellitus with hyperglycemia: Secondary | ICD-10-CM | POA: Diagnosis not present

## 2021-08-21 DIAGNOSIS — I10 Essential (primary) hypertension: Secondary | ICD-10-CM | POA: Diagnosis not present

## 2021-08-31 DIAGNOSIS — Z1231 Encounter for screening mammogram for malignant neoplasm of breast: Secondary | ICD-10-CM | POA: Diagnosis not present

## 2021-09-21 DIAGNOSIS — I10 Essential (primary) hypertension: Secondary | ICD-10-CM | POA: Diagnosis not present

## 2021-09-21 DIAGNOSIS — E1165 Type 2 diabetes mellitus with hyperglycemia: Secondary | ICD-10-CM | POA: Diagnosis not present

## 2021-09-22 DIAGNOSIS — E119 Type 2 diabetes mellitus without complications: Secondary | ICD-10-CM | POA: Diagnosis not present

## 2021-09-22 DIAGNOSIS — M2540 Effusion, unspecified joint: Secondary | ICD-10-CM | POA: Diagnosis not present

## 2021-09-22 DIAGNOSIS — I10 Essential (primary) hypertension: Secondary | ICD-10-CM | POA: Diagnosis not present

## 2021-09-22 DIAGNOSIS — Z0000 Encounter for general adult medical examination without abnormal findings: Secondary | ICD-10-CM | POA: Diagnosis not present

## 2021-09-22 DIAGNOSIS — R52 Pain, unspecified: Secondary | ICD-10-CM | POA: Diagnosis not present

## 2021-10-04 DIAGNOSIS — I10 Essential (primary) hypertension: Secondary | ICD-10-CM | POA: Diagnosis not present

## 2021-10-04 DIAGNOSIS — E1169 Type 2 diabetes mellitus with other specified complication: Secondary | ICD-10-CM | POA: Diagnosis not present

## 2021-10-04 DIAGNOSIS — M25562 Pain in left knee: Secondary | ICD-10-CM | POA: Diagnosis not present

## 2021-10-21 DIAGNOSIS — E1165 Type 2 diabetes mellitus with hyperglycemia: Secondary | ICD-10-CM | POA: Diagnosis not present

## 2021-10-21 DIAGNOSIS — I10 Essential (primary) hypertension: Secondary | ICD-10-CM | POA: Diagnosis not present

## 2021-11-08 ENCOUNTER — Ambulatory Visit (INDEPENDENT_AMBULATORY_CARE_PROVIDER_SITE_OTHER): Payer: Medicare HMO | Admitting: Podiatry

## 2021-11-08 ENCOUNTER — Encounter: Payer: Self-pay | Admitting: Podiatry

## 2021-11-08 DIAGNOSIS — B351 Tinea unguium: Secondary | ICD-10-CM | POA: Diagnosis not present

## 2021-11-08 DIAGNOSIS — E119 Type 2 diabetes mellitus without complications: Secondary | ICD-10-CM

## 2021-11-08 DIAGNOSIS — M2141 Flat foot [pes planus] (acquired), right foot: Secondary | ICD-10-CM

## 2021-11-08 DIAGNOSIS — M2142 Flat foot [pes planus] (acquired), left foot: Secondary | ICD-10-CM

## 2021-11-08 DIAGNOSIS — M79674 Pain in right toe(s): Secondary | ICD-10-CM

## 2021-11-08 DIAGNOSIS — M79675 Pain in left toe(s): Secondary | ICD-10-CM | POA: Diagnosis not present

## 2021-11-08 NOTE — Progress Notes (Signed)
This patient returns to my office for at risk foot care.  This patient requires this care by a professional since this patient will be at risk due to having diabetes and kidney injury.  This patient is unable to cut nails herself since the patient cannot reach her nails.These nails are painful walking and wearing shoes.  This patient presents for at risk foot care today.  General Appearance  Alert, conversant and in no acute stress.  Vascular  Dorsalis pedis and posterior tibial  pulses are not  palpable  Bilaterally  Due to swelling..  Capillary return is within normal limits  bilaterally. Temperature is within normal limits  bilaterally.  Neurologic  Senn-Weinstein monofilament wire test within normal limits  bilaterally. Muscle power within normal limits bilaterally.  Nails Thick disfigured discolored nails with subungual debris  Hallux nails  B/L.. No evidence of bacterial infection or drainage bilaterally.  Orthopedic  No limitations of motion  feet .  No crepitus or effusions noted.  No bony pathology or digital deformities noted.  Pes planus.  Skin  normotropic skin with no porokeratosis noted bilaterally.  No signs of infections or ulcers noted.     Onychomycosis  Pain in right toes  Pain in left toes  Consent was obtained for treatment procedures.   Mechanical debridement of hallux nails   bilaterally performed with a nail nipper.  Filed with dremel without incident. Patient qualifies for diabetic shoes due to diabetes with vascular pathology,  lymphedema and pes planus.   Return office visit  3 months                    Told patient to return for periodic foot care and evaluation due to potential at risk complications.   Quinnten Calvin DPM  

## 2021-12-22 DIAGNOSIS — E1165 Type 2 diabetes mellitus with hyperglycemia: Secondary | ICD-10-CM | POA: Diagnosis not present

## 2021-12-22 DIAGNOSIS — Z794 Long term (current) use of insulin: Secondary | ICD-10-CM | POA: Diagnosis not present

## 2021-12-22 DIAGNOSIS — I10 Essential (primary) hypertension: Secondary | ICD-10-CM | POA: Diagnosis not present

## 2022-01-03 DIAGNOSIS — I10 Essential (primary) hypertension: Secondary | ICD-10-CM | POA: Diagnosis not present

## 2022-01-03 DIAGNOSIS — E1169 Type 2 diabetes mellitus with other specified complication: Secondary | ICD-10-CM | POA: Diagnosis not present

## 2022-01-03 DIAGNOSIS — R600 Localized edema: Secondary | ICD-10-CM | POA: Diagnosis not present

## 2022-01-21 DIAGNOSIS — I10 Essential (primary) hypertension: Secondary | ICD-10-CM | POA: Diagnosis not present

## 2022-01-21 DIAGNOSIS — E1165 Type 2 diabetes mellitus with hyperglycemia: Secondary | ICD-10-CM | POA: Diagnosis not present

## 2022-02-14 ENCOUNTER — Ambulatory Visit: Payer: Medicare HMO | Admitting: Podiatry

## 2022-02-14 ENCOUNTER — Encounter: Payer: Self-pay | Admitting: Podiatry

## 2022-02-14 DIAGNOSIS — M79674 Pain in right toe(s): Secondary | ICD-10-CM

## 2022-02-14 DIAGNOSIS — M2141 Flat foot [pes planus] (acquired), right foot: Secondary | ICD-10-CM

## 2022-02-14 DIAGNOSIS — B351 Tinea unguium: Secondary | ICD-10-CM | POA: Diagnosis not present

## 2022-02-14 DIAGNOSIS — M2142 Flat foot [pes planus] (acquired), left foot: Secondary | ICD-10-CM

## 2022-02-14 DIAGNOSIS — N179 Acute kidney failure, unspecified: Secondary | ICD-10-CM

## 2022-02-14 DIAGNOSIS — M79675 Pain in left toe(s): Secondary | ICD-10-CM

## 2022-02-14 DIAGNOSIS — E119 Type 2 diabetes mellitus without complications: Secondary | ICD-10-CM

## 2022-02-14 NOTE — Progress Notes (Signed)
This patient returns to my office for at risk foot care.  This patient requires this care by a professional since this patient will be at risk due to having diabetes and kidney injury.  This patient is unable to cut nails herself since the patient cannot reach her nails.These nails are painful walking and wearing shoes.  This patient presents for at risk foot care today.  General Appearance  Alert, conversant and in no acute stress.  Vascular  Dorsalis pedis and posterior tibial  pulses are not  palpable  Bilaterally  Due to swelling..  Capillary return is within normal limits  bilaterally. Temperature is within normal limits  bilaterally.  Neurologic  Senn-Weinstein monofilament wire test within normal limits  bilaterally. Muscle power within normal limits bilaterally.  Nails Thick disfigured discolored nails with subungual debris  Hallux nails  B/L.. No evidence of bacterial infection or drainage bilaterally.  Orthopedic  No limitations of motion  feet .  No crepitus or effusions noted.  No bony pathology or digital deformities noted.  Pes planus.  Skin  normotropic skin with no porokeratosis noted bilaterally.  No signs of infections or ulcers noted.     Onychomycosis  Pain in right toes  Pain in left toes  Consent was obtained for treatment procedures.   Mechanical debridement of hallux nails   bilaterally performed with a nail nipper.  Filed with dremel without incident. Patient qualifies for diabetic shoes due to diabetes with vascular pathology,  lymphedema and pes planus.   Return office visit  3 months                    Told patient to return for periodic foot care and evaluation due to potential at risk complications.   Chloe Bradley DPM  

## 2022-02-21 DIAGNOSIS — I10 Essential (primary) hypertension: Secondary | ICD-10-CM | POA: Diagnosis not present

## 2022-02-21 DIAGNOSIS — E1165 Type 2 diabetes mellitus with hyperglycemia: Secondary | ICD-10-CM | POA: Diagnosis not present

## 2022-02-21 DIAGNOSIS — Z794 Long term (current) use of insulin: Secondary | ICD-10-CM | POA: Diagnosis not present

## 2022-03-20 DIAGNOSIS — R079 Chest pain, unspecified: Secondary | ICD-10-CM | POA: Diagnosis not present

## 2022-04-04 DIAGNOSIS — E1169 Type 2 diabetes mellitus with other specified complication: Secondary | ICD-10-CM | POA: Diagnosis not present

## 2022-04-04 DIAGNOSIS — I10 Essential (primary) hypertension: Secondary | ICD-10-CM | POA: Diagnosis not present

## 2022-04-04 DIAGNOSIS — R600 Localized edema: Secondary | ICD-10-CM | POA: Diagnosis not present

## 2022-04-22 DIAGNOSIS — I10 Essential (primary) hypertension: Secondary | ICD-10-CM | POA: Diagnosis not present

## 2022-04-22 DIAGNOSIS — E1165 Type 2 diabetes mellitus with hyperglycemia: Secondary | ICD-10-CM | POA: Diagnosis not present

## 2022-04-22 DIAGNOSIS — Z794 Long term (current) use of insulin: Secondary | ICD-10-CM | POA: Diagnosis not present

## 2022-05-23 DIAGNOSIS — E1165 Type 2 diabetes mellitus with hyperglycemia: Secondary | ICD-10-CM | POA: Diagnosis not present

## 2022-05-23 DIAGNOSIS — I10 Essential (primary) hypertension: Secondary | ICD-10-CM | POA: Diagnosis not present

## 2022-05-24 ENCOUNTER — Ambulatory Visit: Payer: Medicare HMO | Admitting: Podiatry

## 2022-06-07 ENCOUNTER — Ambulatory Visit: Payer: Medicare HMO | Admitting: Podiatry

## 2022-07-04 DIAGNOSIS — I10 Essential (primary) hypertension: Secondary | ICD-10-CM | POA: Diagnosis not present

## 2022-07-04 DIAGNOSIS — R69 Illness, unspecified: Secondary | ICD-10-CM | POA: Diagnosis not present

## 2022-07-04 DIAGNOSIS — R600 Localized edema: Secondary | ICD-10-CM | POA: Diagnosis not present

## 2022-07-04 DIAGNOSIS — E1169 Type 2 diabetes mellitus with other specified complication: Secondary | ICD-10-CM | POA: Diagnosis not present

## 2022-07-19 ENCOUNTER — Ambulatory Visit (INDEPENDENT_AMBULATORY_CARE_PROVIDER_SITE_OTHER): Payer: Medicare HMO | Admitting: Podiatry

## 2022-07-19 ENCOUNTER — Encounter: Payer: Self-pay | Admitting: Podiatry

## 2022-07-19 ENCOUNTER — Ambulatory Visit (INDEPENDENT_AMBULATORY_CARE_PROVIDER_SITE_OTHER): Payer: Medicare HMO

## 2022-07-19 DIAGNOSIS — B351 Tinea unguium: Secondary | ICD-10-CM | POA: Diagnosis not present

## 2022-07-19 DIAGNOSIS — M2142 Flat foot [pes planus] (acquired), left foot: Secondary | ICD-10-CM

## 2022-07-19 DIAGNOSIS — E1159 Type 2 diabetes mellitus with other circulatory complications: Secondary | ICD-10-CM

## 2022-07-19 DIAGNOSIS — M79675 Pain in left toe(s): Secondary | ICD-10-CM | POA: Diagnosis not present

## 2022-07-19 DIAGNOSIS — M2141 Flat foot [pes planus] (acquired), right foot: Secondary | ICD-10-CM | POA: Diagnosis not present

## 2022-07-19 DIAGNOSIS — I890 Lymphedema, not elsewhere classified: Secondary | ICD-10-CM

## 2022-07-19 DIAGNOSIS — M79674 Pain in right toe(s): Secondary | ICD-10-CM

## 2022-07-19 DIAGNOSIS — E119 Type 2 diabetes mellitus without complications: Secondary | ICD-10-CM

## 2022-07-19 NOTE — Progress Notes (Signed)
This patient returns to my office for at risk foot care.  This patient requires this care by a professional since this patient will be at risk due to having diabetes and kidney injury.  This patient is unable to cut nails herself since the patient cannot reach her nails.These nails are painful walking and wearing shoes.  This patient presents for at risk foot care today.  General Appearance  Alert, conversant and in no acute stress.  Vascular  Dorsalis pedis and posterior tibial  pulses are not  palpable  Bilaterally  Due to swelling..  Capillary return is within normal limits  bilaterally. Temperature is within normal limits  bilaterally.  Neurologic  Senn-Weinstein monofilament wire test within normal limits  bilaterally. Muscle power within normal limits bilaterally.  Nails Thick disfigured discolored nails with subungual debris  Hallux nails  B/L.Marland Kitchen No evidence of bacterial infection or drainage bilaterally.  Orthopedic  No limitations of motion  feet .  No crepitus or effusions noted.  No bony pathology or digital deformities noted.  Pes planus.  Skin  normotropic skin with no porokeratosis noted bilaterally.  No signs of infections or ulcers noted.     Onychomycosis  Pain in right toes  Pain in left toes  Consent was obtained for treatment procedures.   Mechanical debridement of hallux nails   bilaterally performed with a nail nipper.  Filed with dremel without incident. Patient qualifies for diabetic shoes due to diabetes with vascular pathology,  lymphedema and pes planus.   Return office visit  3 months                    Told patient to return for periodic foot care and evaluation due to potential at risk complications.   Gardiner Barefoot DPM

## 2022-07-27 NOTE — Addendum Note (Signed)
Addended by: Gardiner Barefoot on: 07/27/2022 12:10 PM   Modules accepted: Orders

## 2022-08-03 NOTE — Progress Notes (Signed)
Patient presents to the office today for diabetic shoe and insole measuring.  Patient was measured with brannock device to determine size and width for 1 pair of extra depth shoes and foam casted for 3 pair of insoles.   ABN signed.   Documentation of medical necessity will be sent to patient's treating diabetic doctor to verify and sign.   Patient's diabetic provider: Mirna Mires, MD   Shoes and insoles will be ordered at that time and patient will be notified for an appointment for fitting when they arrive.   Brannock measurement: 69M  Patient shoe selection-   1st   Shoe choice:   825  Shoe size ordered: 69M

## 2022-09-12 DIAGNOSIS — E119 Type 2 diabetes mellitus without complications: Secondary | ICD-10-CM | POA: Diagnosis not present

## 2022-09-19 ENCOUNTER — Ambulatory Visit (INDEPENDENT_AMBULATORY_CARE_PROVIDER_SITE_OTHER): Payer: Medicare HMO

## 2022-09-19 DIAGNOSIS — E119 Type 2 diabetes mellitus without complications: Secondary | ICD-10-CM

## 2022-09-19 NOTE — Progress Notes (Signed)
Patient presents today to pick up diabetic shoes and insoles.  The shoes were not wide enough to accommodate patients swelling.  I recommended a wider width.   Reorder: wide and extra wide width  Insoles were kept by [facility]  to check fit with reordered shoes.  Patient will be contacted for a fitting appointment once the reordered shoes arrive in office.

## 2022-10-03 DIAGNOSIS — R600 Localized edema: Secondary | ICD-10-CM | POA: Diagnosis not present

## 2022-10-03 DIAGNOSIS — F01A0 Vascular dementia, mild, without behavioral disturbance, psychotic disturbance, mood disturbance, and anxiety: Secondary | ICD-10-CM | POA: Diagnosis not present

## 2022-10-03 DIAGNOSIS — E1169 Type 2 diabetes mellitus with other specified complication: Secondary | ICD-10-CM | POA: Diagnosis not present

## 2022-10-03 DIAGNOSIS — I10 Essential (primary) hypertension: Secondary | ICD-10-CM | POA: Diagnosis not present

## 2022-10-13 ENCOUNTER — Ambulatory Visit (INDEPENDENT_AMBULATORY_CARE_PROVIDER_SITE_OTHER): Payer: Medicare HMO | Admitting: Podiatry

## 2022-10-13 DIAGNOSIS — M2141 Flat foot [pes planus] (acquired), right foot: Secondary | ICD-10-CM

## 2022-10-13 DIAGNOSIS — E1151 Type 2 diabetes mellitus with diabetic peripheral angiopathy without gangrene: Secondary | ICD-10-CM | POA: Diagnosis not present

## 2022-10-13 DIAGNOSIS — M2142 Flat foot [pes planus] (acquired), left foot: Secondary | ICD-10-CM

## 2022-10-13 DIAGNOSIS — R601 Generalized edema: Secondary | ICD-10-CM

## 2022-10-13 NOTE — Progress Notes (Signed)
Patient presents for diabetic shoe fitting and dispensing today.  She has a family member with her today.  There were 2 pairs of shoes ordered for her which were the same style, but 1 was a wide and the other 1 was in extrawide.  She does have swelling in the lower extremity including the foot bilateral.  We started with the extrawide shoes and they were of excellent fit today.  Her right foot is slightly longer than the left and the right shoe fit perfectly with enough space at the tip of the toe.  The left was slightly bigger due to having a smaller foot therefore the thinnest filler was placed in the left shoe and she noted improvement of the foot overall.  They likes the single thicker strap.  Reviewed the break-in period with the shoes.  Informed the patient and her family member that they need to keep the shoes very clean and only wear them inside the house for the following week.  If there are any adjustments or returns that need to be made they need to bring the original shoe box and all the inserts and shoes with them in the shoes must be in good condition to be able to facilitate any exchange or return.  They were instructed to call the office in 1 week to give [place] an update on how they are doing and at that time if she is going to keep the shoes she can begin wearing them outside and they do not need to get the box at that time.  Patient reviewed and signed the proof of delivery form today.  Follow-up as scheduled with Dr. Stacie Acres

## 2022-10-19 ENCOUNTER — Encounter: Payer: Self-pay | Admitting: Podiatry

## 2022-10-19 ENCOUNTER — Ambulatory Visit (INDEPENDENT_AMBULATORY_CARE_PROVIDER_SITE_OTHER): Payer: Medicare HMO | Admitting: Podiatry

## 2022-10-19 DIAGNOSIS — M79675 Pain in left toe(s): Secondary | ICD-10-CM | POA: Diagnosis not present

## 2022-10-19 DIAGNOSIS — B351 Tinea unguium: Secondary | ICD-10-CM

## 2022-10-19 DIAGNOSIS — I890 Lymphedema, not elsewhere classified: Secondary | ICD-10-CM

## 2022-10-19 DIAGNOSIS — E1151 Type 2 diabetes mellitus with diabetic peripheral angiopathy without gangrene: Secondary | ICD-10-CM

## 2022-10-19 DIAGNOSIS — M79674 Pain in right toe(s): Secondary | ICD-10-CM | POA: Diagnosis not present

## 2022-10-19 NOTE — Progress Notes (Signed)
This patient returns to my office for at risk foot care.  This patient requires this care by a professional since this patient will be at risk due to having diabetes and kidney injury.  This patient is unable to cut nails herself since the patient cannot reach her nails.These nails are painful walking and wearing shoes.  This patient presents for at risk foot care today. ° °General Appearance  Alert, conversant and in no acute stress. ° °Vascular  Dorsalis pedis and posterior tibial  pulses are not  palpable  Bilaterally  Due to swelling..  Capillary return is within normal limits  bilaterally. Temperature is within normal limits  bilaterally. ° °Neurologic  Senn-Weinstein monofilament wire test within normal limits  bilaterally. Muscle power within normal limits bilaterally. ° °Nails Thick disfigured discolored nails with subungual debris  Hallux nails  B/L.. No evidence of bacterial infection or drainage bilaterally. ° °Orthopedic  No limitations of motion  feet .  No crepitus or effusions noted.  No bony pathology or digital deformities noted.  Pes planus. ° °Skin  normotropic skin with no porokeratosis noted bilaterally.  No signs of infections or ulcers noted.    ° °Onychomycosis  Pain in right toes  Pain in left toes ° °Consent was obtained for treatment procedures.   Mechanical debridement of hallux nails   bilaterally performed with a nail nipper.  Filed with dremel without incident. Patient qualifies for diabetic shoes due to diabetes with vascular pathology,  lymphedema and pes planus. ° ° °Return office visit  3 months                    Told patient to return for periodic foot care and evaluation due to potential at risk complications. ° ° °Zacherie Honeyman DPM  °

## 2022-10-19 NOTE — Progress Notes (Signed)
Presented today with her diabetic shoes , she states they are rubbing the back of her heel.  Wanted to exchange them for her original pair of shoes .  Ordered pt a [id number] in 8xw due to extreme swelling in feet.

## 2022-10-27 ENCOUNTER — Ambulatory Visit (INDEPENDENT_AMBULATORY_CARE_PROVIDER_SITE_OTHER): Payer: Medicare HMO | Admitting: Podiatry

## 2022-10-27 DIAGNOSIS — I890 Lymphedema, not elsewhere classified: Secondary | ICD-10-CM

## 2022-10-27 DIAGNOSIS — M2142 Flat foot [pes planus] (acquired), left foot: Secondary | ICD-10-CM

## 2022-10-27 DIAGNOSIS — M2141 Flat foot [pes planus] (acquired), right foot: Secondary | ICD-10-CM

## 2022-10-27 DIAGNOSIS — E1151 Type 2 diabetes mellitus with diabetic peripheral angiopathy without gangrene: Secondary | ICD-10-CM

## 2022-10-27 NOTE — Progress Notes (Signed)
Patient presents today to pick up diabetic shoes and insoles.  Patient was dispensed 1 pair of diabetic shoes and 3 pairs of foam casted diabetic insoles. Fit was satisfactory. Instructions for break-in and wear was reviewed and a copy was given to the patient.   Re-appointment for regularly scheduled diabetic foot care visits or if they should experience any trouble with the shoes or insoles.  

## 2022-11-02 DIAGNOSIS — R600 Localized edema: Secondary | ICD-10-CM | POA: Diagnosis not present

## 2022-11-02 DIAGNOSIS — I10 Essential (primary) hypertension: Secondary | ICD-10-CM | POA: Diagnosis not present

## 2023-01-02 DIAGNOSIS — E1169 Type 2 diabetes mellitus with other specified complication: Secondary | ICD-10-CM | POA: Diagnosis not present

## 2023-01-02 DIAGNOSIS — I10 Essential (primary) hypertension: Secondary | ICD-10-CM | POA: Diagnosis not present

## 2023-01-02 DIAGNOSIS — R600 Localized edema: Secondary | ICD-10-CM | POA: Diagnosis not present

## 2023-01-02 DIAGNOSIS — F01A0 Vascular dementia, mild, without behavioral disturbance, psychotic disturbance, mood disturbance, and anxiety: Secondary | ICD-10-CM | POA: Diagnosis not present

## 2023-01-18 ENCOUNTER — Encounter: Payer: Self-pay | Admitting: Podiatry

## 2023-01-18 ENCOUNTER — Ambulatory Visit (INDEPENDENT_AMBULATORY_CARE_PROVIDER_SITE_OTHER): Payer: Medicare HMO | Admitting: Podiatry

## 2023-01-18 DIAGNOSIS — E1151 Type 2 diabetes mellitus with diabetic peripheral angiopathy without gangrene: Secondary | ICD-10-CM

## 2023-01-18 DIAGNOSIS — B351 Tinea unguium: Secondary | ICD-10-CM | POA: Diagnosis not present

## 2023-01-18 DIAGNOSIS — M79675 Pain in left toe(s): Secondary | ICD-10-CM

## 2023-01-18 DIAGNOSIS — M79674 Pain in right toe(s): Secondary | ICD-10-CM | POA: Diagnosis not present

## 2023-01-18 NOTE — Progress Notes (Signed)
This patient returns to my office for at risk foot care.  This patient requires this care by a professional since this patient will be at risk due to having diabetes and kidney injury.  This patient is unable to cut nails herself since the patient cannot reach her nails.These nails are painful walking and wearing shoes.  This patient presents for at risk foot care today.  General Appearance  Alert, conversant and in no acute stress.  Vascular  Dorsalis pedis and posterior tibial  pulses are not  palpable  Bilaterally  Due to swelling..  Capillary return is within normal limits  bilaterally. Temperature is within normal limits  bilaterally.  Neurologic  Senn-Weinstein monofilament wire test within normal limits  bilaterally. Muscle power within normal limits bilaterally.  Nails Thick disfigured discolored nails with subungual debris  Hallux nails  B/L.Marland Kitchen No evidence of bacterial infection or drainage bilaterally.  Orthopedic  No limitations of motion  feet .  No crepitus or effusions noted.  No bony pathology or digital deformities noted.  Pes planus.  Skin  normotropic skin with no porokeratosis noted bilaterally.  No signs of infections or ulcers noted.     Onychomycosis  Pain in right toes  Pain in left toes  Consent was obtained for treatment procedures.   Mechanical debridement of hallux nails   bilaterally performed with a nail nipper.     Return office visit  3 months                    Told patient to return for periodic foot care and evaluation due to potential at risk complications.   Helane Gunther DPM

## 2023-02-27 DIAGNOSIS — E1169 Type 2 diabetes mellitus with other specified complication: Secondary | ICD-10-CM | POA: Diagnosis not present

## 2023-02-27 DIAGNOSIS — R600 Localized edema: Secondary | ICD-10-CM | POA: Diagnosis not present

## 2023-02-27 DIAGNOSIS — I10 Essential (primary) hypertension: Secondary | ICD-10-CM | POA: Diagnosis not present

## 2023-02-27 DIAGNOSIS — R1310 Dysphagia, unspecified: Secondary | ICD-10-CM | POA: Diagnosis not present

## 2023-02-27 DIAGNOSIS — I872 Venous insufficiency (chronic) (peripheral): Secondary | ICD-10-CM | POA: Diagnosis not present

## 2023-04-12 ENCOUNTER — Ambulatory Visit (INDEPENDENT_AMBULATORY_CARE_PROVIDER_SITE_OTHER): Payer: Medicare HMO | Admitting: Podiatry

## 2023-04-12 ENCOUNTER — Encounter: Payer: Self-pay | Admitting: Podiatry

## 2023-04-12 DIAGNOSIS — M79675 Pain in left toe(s): Secondary | ICD-10-CM | POA: Diagnosis not present

## 2023-04-12 DIAGNOSIS — E1151 Type 2 diabetes mellitus with diabetic peripheral angiopathy without gangrene: Secondary | ICD-10-CM

## 2023-04-12 DIAGNOSIS — M79674 Pain in right toe(s): Secondary | ICD-10-CM | POA: Diagnosis not present

## 2023-04-12 DIAGNOSIS — B351 Tinea unguium: Secondary | ICD-10-CM

## 2023-04-12 DIAGNOSIS — I890 Lymphedema, not elsewhere classified: Secondary | ICD-10-CM

## 2023-04-12 NOTE — Progress Notes (Signed)
This patient returns to my office for at risk foot care.  This patient requires this care by a professional since this patient will be at risk due to having diabetes and kidney injury.  This patient is unable to cut nails herself since the patient cannot reach her nails.These nails are painful walking and wearing shoes.  This patient presents for at risk foot care today.  General Appearance  Alert, conversant and in no acute stress.  Vascular  Dorsalis pedis and posterior tibial  pulses are not  palpable  Bilaterally  Due to swelling..  Capillary return is within normal limits  bilaterally. Temperature is within normal limits  bilaterally.  Neurologic  Senn-Weinstein monofilament wire test within normal limits  bilaterally. Muscle power within normal limits bilaterally.  Nails Thick disfigured discolored nails with subungual debris  Hallux nails  B/L.Marland Kitchen No evidence of bacterial infection or drainage bilaterally.  Orthopedic  No limitations of motion  feet .  No crepitus or effusions noted.  No bony pathology or digital deformities noted.  Pes planus.  Skin  normotropic skin with no porokeratosis noted bilaterally.  No signs of infections or ulcers noted.     Onychomycosis  Pain in right toes  Pain in left toes  Consent was obtained for treatment procedures.   Mechanical debridement of hallux nails   bilaterally performed with a nail nipper.     Return office visit  3 months                    Told patient to return for periodic foot care and evaluation due to potential at risk complications.   Helane Gunther DPM

## 2023-04-13 ENCOUNTER — Emergency Department (HOSPITAL_COMMUNITY): Payer: Medicare HMO

## 2023-04-13 ENCOUNTER — Encounter (HOSPITAL_COMMUNITY): Payer: Self-pay

## 2023-04-13 ENCOUNTER — Emergency Department (HOSPITAL_COMMUNITY)
Admission: EM | Admit: 2023-04-13 | Discharge: 2023-04-13 | Disposition: A | Discharge status: Home / Self Care | Payer: Medicare HMO | Attending: Emergency Medicine | Admitting: Emergency Medicine

## 2023-04-13 ENCOUNTER — Other Ambulatory Visit: Payer: Self-pay

## 2023-04-13 ENCOUNTER — Ambulatory Visit (INDEPENDENT_AMBULATORY_CARE_PROVIDER_SITE_OTHER): Payer: Medicare HMO

## 2023-04-13 ENCOUNTER — Ambulatory Visit (HOSPITAL_COMMUNITY)
Admission: RE | Admit: 2023-04-13 | Discharge: 2023-04-13 | Disposition: A | Discharge status: Home / Self Care | Payer: Medicare HMO | Source: Ambulatory Visit | Attending: Nurse Practitioner | Admitting: Nurse Practitioner

## 2023-04-13 VITALS — BP 169/87 | HR 80 | Temp 98.3°F | Resp 16 | Ht 62.0 in

## 2023-04-13 DIAGNOSIS — S0990XA Unspecified injury of head, initial encounter: Secondary | ICD-10-CM

## 2023-04-13 DIAGNOSIS — W19XXXA Unspecified fall, initial encounter: Secondary | ICD-10-CM

## 2023-04-13 DIAGNOSIS — S0081XA Abrasion of other part of head, initial encounter: Secondary | ICD-10-CM | POA: Insufficient documentation

## 2023-04-13 DIAGNOSIS — M25561 Pain in right knee: Secondary | ICD-10-CM | POA: Insufficient documentation

## 2023-04-13 DIAGNOSIS — W0110XA Fall on same level from slipping, tripping and stumbling with subsequent striking against unspecified object, initial encounter: Secondary | ICD-10-CM | POA: Insufficient documentation

## 2023-04-13 DIAGNOSIS — M25532 Pain in left wrist: Secondary | ICD-10-CM

## 2023-04-13 DIAGNOSIS — S0083XA Contusion of other part of head, initial encounter: Secondary | ICD-10-CM | POA: Diagnosis not present

## 2023-04-13 DIAGNOSIS — Z23 Encounter for immunization: Secondary | ICD-10-CM | POA: Diagnosis not present

## 2023-04-13 DIAGNOSIS — Z8673 Personal history of transient ischemic attack (TIA), and cerebral infarction without residual deficits: Secondary | ICD-10-CM | POA: Insufficient documentation

## 2023-04-13 DIAGNOSIS — R519 Headache, unspecified: Secondary | ICD-10-CM | POA: Diagnosis not present

## 2023-04-13 DIAGNOSIS — I6381 Other cerebral infarction due to occlusion or stenosis of small artery: Secondary | ICD-10-CM | POA: Diagnosis not present

## 2023-04-13 DIAGNOSIS — S199XXA Unspecified injury of neck, initial encounter: Secondary | ICD-10-CM | POA: Diagnosis not present

## 2023-04-13 DIAGNOSIS — I10 Essential (primary) hypertension: Secondary | ICD-10-CM | POA: Diagnosis not present

## 2023-04-13 DIAGNOSIS — M7989 Other specified soft tissue disorders: Secondary | ICD-10-CM | POA: Diagnosis not present

## 2023-04-13 DIAGNOSIS — M160 Bilateral primary osteoarthritis of hip: Secondary | ICD-10-CM | POA: Diagnosis not present

## 2023-04-13 MED ORDER — TETANUS-DIPHTH-ACELL PERTUSSIS 5-2.5-18.5 LF-MCG/0.5 IM SUSY
0.5000 mL | PREFILLED_SYRINGE | Freq: Once | INTRAMUSCULAR | Status: AC
  Administered 2023-04-13: 0.5 mL via INTRAMUSCULAR
  Filled 2023-04-13: qty 0.5

## 2023-04-13 NOTE — ED Provider Notes (Signed)
Staff was called out to the parking lot.  Patient had a fall, fell face forward onto the asphalt.  She has multiple abrasions to her face.  She is alert and oriented after the fall.  Bleeding is controlled, is not on any blood thinners.  Due to age, will send to emergency department for further evaluation and advanced imaging.  Staff to contact [facility] for transport.  Transport discussed with patient and family member, agreeable to plan.   Curly Mackowski, [place], [place] 04/13/23 [phone]

## 2023-04-13 NOTE — ED Notes (Signed)
Pt fell in parking lot after being seen by UC staff. Fall was not witnessed by any UC staff. Pt was with family member at the time. Pt brought back inside UC post fall and evaluated. EMS called do to injury to head.

## 2023-04-13 NOTE — ED Notes (Signed)
Pt fell in UC parking lot after being discharged. Pt in triage room, Medical Provider evaluating pt. Vitals taken. HR  80 O2 99 BP 169/87. EMS called by RN.

## 2023-04-13 NOTE — ED Notes (Signed)
Patient transported to CT 

## 2023-04-13 NOTE — Discharge Instructions (Signed)
You were seen in the emergency department for evaluation of injuries from a fall.  You had x-rays of your right knee and a CAT scan of your head and neck.  They did not see any acute fractures or other traumatic findings.  On the CAT scan of your head it did show some signs of a stroke although they cannot tell how recently this may have happened.  This will need follow-up with your primary care doctor and neurology.  I have put in a referral for you to see neurology.  You can use ice to your injury areas and Tylenol for pain.

## 2023-04-13 NOTE — ED Notes (Signed)
This RN spoke with [place] charge nurse, Martie Lee RN, to notify of pt's arrival via EMS.

## 2023-04-13 NOTE — ED Provider Notes (Signed)
[facility] [facility] AT [facility] Provider Note   CSN: [id number] Arrival date & time: 04/13/23  1450     History {Add pertinent medical, surgical, social history, OB history to HPI:1} No chief complaint on file.   Chloe Bradley is a 89 y.o. female.  She is here for evaluation of injuries from a fall.  She was over at urgent care, had been seen for left wrist pain for 3 months.  As she was exiting the building she fell, possibly tripped over something.  Hit her head.  She denies any loss of consciousness.  Complaining of some head pain.  She was brought over here by EMS.  She is not sure if she is on a blood thinner.  The history is provided by the patient and the EMS personnel.  Fall This is a new problem. The current episode started less than 1 hour ago. The problem has not changed since onset.Associated symptoms include headaches. Pertinent negatives include no chest pain, no abdominal pain and no shortness of breath. Nothing aggravates the symptoms. Nothing relieves the symptoms. She has tried nothing for the symptoms. The treatment provided no relief.       Home Medications Prior to Admission medications   Medication Sig Start Date End Date Taking? Authorizing Provider  EDARBI 80 MG TABS  01/17/19   [provider]  EDARBYCLOR 40-12.5 MG TABS  09/23/18   [provider]  felodipine (PLENDIL) 10 MG 24 hr tablet Take 10 mg by mouth every evening.  08/20/18   [provider]  TRADJENTA 5 MG TABS tablet  10/09/18   [provider]      Allergies    Ace inhibitors, Ace inhibitors, and Shellfish allergy    Review of Systems   Review of Systems  Constitutional:  Negative for fever.  Eyes:  Negative for visual disturbance.  Respiratory:  Negative for shortness of breath.   Cardiovascular:  Negative for chest pain.  Gastrointestinal:  Negative for abdominal pain.  Musculoskeletal:  Negative for neck pain.  Skin:  Positive  for wound.  Neurological:  Positive for headaches.    Physical Exam Updated Vital Signs BP (!) 169/87 (BP Location: Right Arm)   Pulse 80   Resp 16   SpO2 99%  Physical Exam Vitals and nursing note reviewed.  Constitutional:      General: She is not in acute distress.    Appearance: Normal appearance. She is well-developed.  HENT:     Head: Normocephalic.     Comments: She has a few areas of abrasion over her head.  No gross foreign body. Eyes:     Conjunctiva/sclera: Conjunctivae normal.  Cardiovascular:     Rate and Rhythm: Normal rate and regular rhythm.     Heart sounds: No murmur heard. Pulmonary:     Effort: Pulmonary effort is normal. No respiratory distress.     Breath sounds: Normal breath sounds.  Abdominal:     Palpations: Abdomen is soft.     Tenderness: There is no abdominal tenderness. There is no guarding or rebound.  Musculoskeletal:        General: No deformity. Normal range of motion.     Cervical back: Neck supple.     Right lower leg: Edema present.     Left lower leg: Edema present.     Comments: She has full range of motion of her right upper extremity and bilateral lower extremities.  Her left upper extremity is in  a Velcro splint.  She denies any new pain to that area.  Skin:    General: Skin is warm and dry.     Capillary Refill: Capillary refill takes less than 2 seconds.  Neurological:     General: No focal deficit present.     Mental Status: She is alert.     Motor: No weakness.     ED Results / Procedures / Treatments   Labs (all labs ordered are listed, but only abnormal results are displayed) Labs Reviewed - No data to display  EKG None  Radiology DG Wrist Complete Left Result Date: 04/13/2023 CLINICAL DATA:  Left wrist pain and swelling. EXAM: LEFT WRIST - COMPLETE 3+ VIEW COMPARISON:  None Available. FINDINGS: There is no evidence of fracture or dislocation. There is no evidence of arthropathy or other focal bone abnormality.  Soft tissues are unremarkable. IMPRESSION: Negative. Electronically Signed   By: Lupita Raider M.D.   On: 04/13/2023 15:11    Procedures Procedures  {Document cardiac monitor, telemetry assessment procedure when appropriate:1}  Medications Ordered in ED Medications  Tdap (BOOSTRIX) injection 0.5 mL (has no administration in time range)    ED Course/ Medical Decision Making/ A&P   {   Click here for ABCD2, HEART and other calculatorsREFRESH Note before signing :1}                              Medical Decision Making Amount and/or Complexity of Data Reviewed Radiology: ordered.  Risk Prescription drug management.   This patient complains of ***; this involves an extensive number of treatment Options and is a complaint that carries with it a high risk of complications and morbidity. The differential includes ***  I ordered, reviewed and interpreted labs, which included *** I ordered medication *** and reviewed PMP when indicated. I ordered imaging studies which included *** and I independently    visualized and interpreted imaging which showed *** Additional history obtained from *** Previous records obtained and reviewed *** I consulted *** and discussed lab and imaging findings and discussed disposition.  Cardiac monitoring reviewed, *** Social determinants considered, *** Critical Interventions: ***  After the interventions stated above, I reevaluated the patient and found *** Admission and further testing considered, ***   {Document critical care time when appropriate:1} {Document review of labs and clinical decision tools ie heart score, Chads2Vasc2 etc:1}  {Document your independent review of radiology images, and any outside records:1} {Document your discussion with family members, caretakers, and with consultants:1} {Document social determinants of health affecting pt's care:1} {Document your decision making why or why not admission, treatments were  needed:1} Final Clinical Impression(s) / ED Diagnoses Final diagnoses:  None    Rx / DC Orders ED Discharge Orders     None

## 2023-04-13 NOTE — Discharge Instructions (Addendum)
Your x-ray over read is still pending.  You have been provided with a left wrist brace with a little more support.  You are encouraged to continue with Tylenol arthritis and Salonpas patches until you are seen by orthopedics for further evaluation and treatment recommend patient's.

## 2023-04-13 NOTE — ED Provider Notes (Signed)
[facility]    CSN: [id number] Arrival date & time: 04/13/23  1212      History   Chief Complaint Chief Complaint  Patient presents with   Wrist Pain    HPI Chloe Bradley is a 89 y.o. female.   HPI  She is in today with her daughter for left wrist pain that has been ongoing for approximately 3 months.  She denies a fall her daughter reports that she lives in the home with her special needs son.  They have cameras throughout but there is no obvious evidence of a fall.  She describes the pain is sensitive.  She denies any numbness, or tingling.  She does have some weakness due to the location. Past Medical History:  Diagnosis Date   Diabetes mellitus without complication (HCC)    Hypertension     Patient Active Problem List   Diagnosis Date Noted   Pes planus 05/10/2021   Pain due to onychomycosis of toenails of both feet 03/12/2019   Diabetes mellitus without complication (HCC) 03/12/2019   Pressure injury of skin 10/15/2018   Sepsis secondary to UTI (HCC) 10/14/2018   AKI (acute kidney injury) (HCC) 10/14/2018   Generalized weakness 10/14/2018   HTN (hypertension) 10/14/2018   DM (diabetes mellitus) (HCC) 10/14/2018   UTI (urinary tract infection) 10/14/2018    Past Surgical History:  Procedure Laterality Date   ABDOMINAL HYSTERECTOMY     EYE SURGERY     bilateral cataract extraction    OB History   No obstetric history on file.      Home Medications    Prior to Admission medications   Medication Sig Start Date End Date Taking? Authorizing Provider  EDARBI 80 MG TABS  01/17/19   [provider]  EDARBYCLOR 40-12.5 MG TABS  09/23/18   [provider]  felodipine (PLENDIL) 10 MG 24 hr tablet Take 10 mg by mouth every evening.  08/20/18   [provider]  TRADJENTA 5 MG TABS tablet  10/09/18   [provider]    Family History History reviewed. No pertinent family history.  Social History Social History    Tobacco Use   Smoking status: Never   Smokeless tobacco: Never  Substance Use Topics   Alcohol use: Never   Drug use: Never     Allergies   Ace inhibitors, Ace inhibitors, and Shellfish allergy   Review of Systems Review of Systems   Physical Exam Triage Vital Signs ED Triage Vitals  Encounter Vitals Group     BP 04/13/23 1248 (!) 149/75     Systolic BP Percentile --      Diastolic BP Percentile --      Pulse Rate 04/13/23 1248 80     Resp 04/13/23 1248 16     Temp 04/13/23 1248 98.3 F (36.8 C)     Temp Source 04/13/23 1248 Oral     SpO2 04/13/23 1248 97 %     Weight --      Height 04/13/23 1247 5\' 2"  (1.575 m)     Head Circumference --      Peak Flow --      Pain Score 04/13/23 1247 5     Pain Loc --      Pain Education --      Exclude from Growth Chart --    No data found.  Updated Vital Signs BP (!) 169/87 (BP Location: Right Arm)   Pulse 80   Temp 98.3 F (  36.8 C) (Oral)   Resp 16   Ht 5\' 2"  (1.575 m)   SpO2 99%   BMI 30.18 kg/m   Visual Acuity Right Eye Distance:   Left Eye Distance:   Bilateral Distance:    Right Eye Near:   Left Eye Near:    Bilateral Near:     Physical Exam Constitutional:      General: She is not in acute distress.    Appearance: She is normal weight.  Musculoskeletal:     Right wrist: Normal.     Left wrist: Swelling, deformity, effusion and tenderness present. Decreased range of motion. Normal pulse.     Right hand: Normal.     Left hand: Swelling present. Decreased strength. Normal capillary refill. Normal pulse.  Neurological:     Mental Status: She is alert.      UC Treatments / Results  Labs (all labs ordered are listed, but only abnormal results are displayed) Labs Reviewed - No data to display  EKG   Radiology DG Wrist Complete Left Result Date: 04/13/2023 CLINICAL DATA:  Left wrist pain and swelling. EXAM: LEFT WRIST - COMPLETE 3+ VIEW COMPARISON:  None Available. FINDINGS: There is no  evidence of fracture or dislocation. There is no evidence of arthropathy or other focal bone abnormality. Soft tissues are unremarkable. IMPRESSION: Negative. Electronically Signed   By: Lupita Raider M.D.   On: 04/13/2023 15:11    Procedures Procedures (including critical care time)  Medications Ordered in UC Medications - No data to display  Initial Impression / Assessment and Plan / UC Course  I have reviewed the triage vital signs and the nursing notes.  Pertinent labs & imaging results that were available during my care of the patient were reviewed by me and considered in my medical decision making (see chart for details).     Left wrist pain Final Clinical Impressions(s) / UC Diagnoses   Final diagnoses:  Left wrist pain     Discharge Instructions      Your x-ray over read is still pending.  You have been provided with a left wrist brace with a little more support.  You are encouraged to continue with Tylenol arthritis and Salonpas patches until you are seen by orthopedics for further evaluation and treatment recommend patient's.     ED Prescriptions   None    PDMP not reviewed this encounter.   Thad Ranger [place], [place] 04/13/23 [phone]

## 2023-04-13 NOTE — ED Notes (Signed)
Pt ambulated with little assistance. Pt's daughter/past caregiver stated she was at her normal. Provider made aware via secure chat.

## 2023-04-13 NOTE — ED Triage Notes (Signed)
Pt bib ems from urgent care; c/o L arm pain x 3 months; on the way out, pt tripped and fell; not on thinners; hit head, skin tears to forehead; c/o r knee pain; ice on arrival; redness to L arm, painful with palpation; said she had a shot but could not say for what; 168/88, p 80, 99% RA, rr 18

## 2023-04-13 NOTE — ED Notes (Signed)
Patient is being discharged from the Urgent Care and sent to the Emergency Department via EMS . Per Thad Ranger, NP, patient is in need of higher level of care due to head injury/fall. Patient is aware and verbalizes understanding of plan of care.  Vitals:   04/13/23 1248 04/13/23 1430  BP: (!) 149/75 (!) 169/87  Pulse: 80 80  Resp: 16   Temp: 98.3 F (36.8 C) 98.3 F (36.8 C)  SpO2: 97% 99%

## 2023-04-13 NOTE — ED Triage Notes (Signed)
Patient here today with c/o left wrist pain X 3 months. No known injury. She has been taking Tylenol Arthritis and using lidocaine patches with not much relief.

## 2023-05-02 DIAGNOSIS — M25532 Pain in left wrist: Secondary | ICD-10-CM | POA: Diagnosis not present

## 2023-05-07 ENCOUNTER — Telehealth: Payer: Self-pay | Admitting: Neurology

## 2023-05-07 ENCOUNTER — Encounter: Payer: Self-pay | Admitting: Neurology

## 2023-05-07 ENCOUNTER — Ambulatory Visit (INDEPENDENT_AMBULATORY_CARE_PROVIDER_SITE_OTHER): Payer: Medicare HMO | Admitting: Neurology

## 2023-05-07 VITALS — BP 132/78 | HR 68 | Ht 62.0 in | Wt 140.0 lb

## 2023-05-07 DIAGNOSIS — I679 Cerebrovascular disease, unspecified: Secondary | ICD-10-CM | POA: Diagnosis not present

## 2023-05-07 DIAGNOSIS — M47812 Spondylosis without myelopathy or radiculopathy, cervical region: Secondary | ICD-10-CM

## 2023-05-07 DIAGNOSIS — R269 Unspecified abnormalities of gait and mobility: Secondary | ICD-10-CM | POA: Insufficient documentation

## 2023-05-07 NOTE — Telephone Encounter (Signed)
 [facility] is taking this patient.

## 2023-05-07 NOTE — Telephone Encounter (Signed)
 sent to GI they obtain [facility] Berkley Harvey [phone]

## 2023-05-07 NOTE — Addendum Note (Signed)
 Addended by: Levert Feinstein on: 05/07/2023 05:46 PM   Modules accepted: Level of Service

## 2023-05-07 NOTE — Progress Notes (Signed)
 Chief Complaint  Patient presents with   New Patient (Initial Visit)    Pt in 15, here with daughter Aissatou  Pt here from ED referral for head injury/infarct on MRI. Pt states she had a fall and hit her head on the concrete. Pt's daughter states the imaging showed a stroke.       ASSESSMENT AND PLAN  Chloe Bradley is a 89 y.o. 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   MEDICAL HISTORY:  Chloe Bradley, is a 89 year old female, accompanied by her daughter seen in request by her primary care from [facility] Dr. Leigh, El[place], for evaluation of abnormal CAT scan, initial evaluation was on May 07, 2023  History is obtained from the patient and review of electronic medical records. I personally reviewed pertinent available imaging films in PACS.   PMHx of  HTN DM Cataract surgery,   She lives at home with her son, quit driving few years ago, enjoying reading, writing poems, but becoming less active, more sedentary, developed gradual onset mild unsteady gait over the past 6 months, began to use walker  She had a few months history of left shoulder and arm pain, on the day of April 13, 2023, daughter took her to urgent care, she only carry her cane with her, on her way out, her knee gave out underneath her, fell face down on the concrete floor, no loss of consciousness, she was able to get up with help  CT head without contrast, no acute abnormality, extensive periventricular small vessel disease left caudate lacunar infarction  CT cervical spine multilevel degenerative changes, extensive osteophyte, at least moderate  stenosis at C6-7,  She also has slow worsening urinary urgency, now on left wrist splint  PHYSICAL EXAM:   Vitals:   05/07/23 1521  BP: 132/78  Pulse: 68  Weight: 140 lb (63.5 kg)  Height: 5' 2 (1.575 m)     Body mass index is 25.61 kg/m.  PHYSICAL EXAMNIATION:  Gen: NAD, conversant, well nourised, well groomed                     Cardiovascular: Regular rate rhythm, no peripheral edema, warm, nontender. Eyes: Conjunctivae clear without exudates or hemorrhage Neck: Supple, no carotid bruits. Pulmonary: Clear to auscultation bilaterally   NEUROLOGICAL EXAM:  MENTAL STATUS: Speech/cognition: Awake, alert, oriented to history taking and casual conversation CRANIAL NERVES: CN II: Visual fields are full to confrontation. Pupils are round equal and briskly reactive to light. CN III, IV, VI: extraocular movement are normal. No ptosis. CN V: Facial sensation is intact to light touch CN VII: Face is symmetric with normal eye closure  CN VIII: Hearing is normal to causal conversation. CN IX, X: Phonation is normal. CN XI: Head turning and shoulder shrug are intact  MOTOR: Left wrist in splint, limited range of motion of left shoulder, mild fixation of left arm on rapid rotating movement,  REFLEXES: Reflexes are 2+ and symmetric at the biceps, triceps, knees, and absent ankles. Plantar responses are extensor bilaterally  SENSORY: Length-dependent decreased to light touch, pinprick and vibratory sensation to midshin level,  bilateral lower extremity, left arm pitting edema  COORDINATION: There is  no trunk or limb dysmetria noted.  GAIT/STANCE: Push-up to get up from seated position, stiff, cautious.  REVIEW OF SYSTEMS:  Full 14 system review of systems performed and notable only for as above All other review of systems were negative.   ALLERGIES: Allergies  Allergen Reactions   Ace Inhibitors Anaphylaxis   Ace Inhibitors Nausea And Vomiting and Other (See Comments)     PAH/no circulation, kidneys stopped working and moved to ICU for 8 days   Shellfish Allergy Hives    HOME MEDICATIONS: Current Outpatient Medications  Medication Sig Dispense Refill   EDARBI 80 MG TABS      EDARBYCLOR 40-12.5 MG TABS      felodipine (PLENDIL) 10 MG 24 hr tablet Take 10 mg by mouth every evening.      TRADJENTA 5 MG TABS tablet      No current facility-administered medications for this visit.    PAST MEDICAL HISTORY: Past Medical History:  Diagnosis Date   Diabetes mellitus without complication (HCC)    Hypertension     PAST SURGICAL HISTORY: Past Surgical History:  Procedure Laterality Date   ABDOMINAL HYSTERECTOMY     EYE SURGERY     bilateral cataract extraction    FAMILY HISTORY: History reviewed. No pertinent family history.  SOCIAL HISTORY: Social History   Socioeconomic History   Marital status: Single    Spouse name: Not on file   Number of children: Not on file   Years of education: Not on file   Highest education level: Not on file  Occupational History   Not on file  Tobacco Use   Smoking status: Never   Smokeless tobacco: Never  Vaping Use   Vaping status: Never Used  Substance and Sexual Activity   Alcohol  use: Never   Drug use: Never   Sexual activity: Not on file  Other Topics Concern   Not on file  Social History Narrative   ** Merged History Encounter **       ** Merged History Encounter **       Social Drivers of Corporate Investment Banker Strain: Not on file  Food Insecurity: Not on file  Transportation Needs: Not on file  Physical Activity: Not on file  Stress: Not on file  Social Connections: Not on file  Intimate Partner Violence: Not on file      Modena Callander, M.D. Ph.D.  [facility] [address], [address] [place], [place] [address] Ph: [phone] Fax: [phone]  CC:  Towana Ozell BROCKS, MD [address] [place],  [place] [address]  Leigh Lung, MD

## 2023-05-09 ENCOUNTER — Telehealth: Payer: Self-pay | Admitting: Neurology

## 2023-05-09 DIAGNOSIS — M4802 Spinal stenosis, cervical region: Secondary | ICD-10-CM | POA: Diagnosis not present

## 2023-05-09 DIAGNOSIS — M47812 Spondylosis without myelopathy or radiculopathy, cervical region: Secondary | ICD-10-CM | POA: Diagnosis not present

## 2023-05-09 DIAGNOSIS — Z87440 Personal history of urinary (tract) infections: Secondary | ICD-10-CM | POA: Diagnosis not present

## 2023-05-09 DIAGNOSIS — Z9181 History of falling: Secondary | ICD-10-CM | POA: Diagnosis not present

## 2023-05-09 DIAGNOSIS — Z993 Dependence on wheelchair: Secondary | ICD-10-CM | POA: Diagnosis not present

## 2023-05-09 DIAGNOSIS — E119 Type 2 diabetes mellitus without complications: Secondary | ICD-10-CM | POA: Diagnosis not present

## 2023-05-09 DIAGNOSIS — Z556 Problems related to health literacy: Secondary | ICD-10-CM | POA: Diagnosis not present

## 2023-05-09 DIAGNOSIS — R32 Unspecified urinary incontinence: Secondary | ICD-10-CM | POA: Diagnosis not present

## 2023-05-09 DIAGNOSIS — Z7984 Long term (current) use of oral hypoglycemic drugs: Secondary | ICD-10-CM | POA: Diagnosis not present

## 2023-05-09 DIAGNOSIS — Z8673 Personal history of transient ischemic attack (TIA), and cerebral infarction without residual deficits: Secondary | ICD-10-CM | POA: Diagnosis not present

## 2023-05-09 DIAGNOSIS — I10 Essential (primary) hypertension: Secondary | ICD-10-CM | POA: Diagnosis not present

## 2023-05-09 NOTE — Telephone Encounter (Signed)
 Called spoke to [facility] and gave the approval for verbal orders

## 2023-05-09 NOTE — Telephone Encounter (Signed)
 Rochelle PT with [facility] has called for verbal orders for 1 week 4 , 2 week 3 and request for OT eval  because pt is having significant weakness and pain in left arm, Rochelle # with secure vm is [phone]

## 2023-05-17 DIAGNOSIS — I10 Essential (primary) hypertension: Secondary | ICD-10-CM | POA: Diagnosis not present

## 2023-05-17 DIAGNOSIS — Z7984 Long term (current) use of oral hypoglycemic drugs: Secondary | ICD-10-CM | POA: Diagnosis not present

## 2023-05-17 DIAGNOSIS — E119 Type 2 diabetes mellitus without complications: Secondary | ICD-10-CM | POA: Diagnosis not present

## 2023-05-17 DIAGNOSIS — Z9181 History of falling: Secondary | ICD-10-CM | POA: Diagnosis not present

## 2023-05-17 DIAGNOSIS — Z8673 Personal history of transient ischemic attack (TIA), and cerebral infarction without residual deficits: Secondary | ICD-10-CM | POA: Diagnosis not present

## 2023-05-17 DIAGNOSIS — Z556 Problems related to health literacy: Secondary | ICD-10-CM | POA: Diagnosis not present

## 2023-05-17 DIAGNOSIS — R32 Unspecified urinary incontinence: Secondary | ICD-10-CM | POA: Diagnosis not present

## 2023-05-17 DIAGNOSIS — M47812 Spondylosis without myelopathy or radiculopathy, cervical region: Secondary | ICD-10-CM | POA: Diagnosis not present

## 2023-05-17 DIAGNOSIS — M4802 Spinal stenosis, cervical region: Secondary | ICD-10-CM | POA: Diagnosis not present

## 2023-05-17 DIAGNOSIS — Z993 Dependence on wheelchair: Secondary | ICD-10-CM | POA: Diagnosis not present

## 2023-05-17 DIAGNOSIS — Z87440 Personal history of urinary (tract) infections: Secondary | ICD-10-CM | POA: Diagnosis not present

## 2023-05-18 DIAGNOSIS — Z87440 Personal history of urinary (tract) infections: Secondary | ICD-10-CM | POA: Diagnosis not present

## 2023-05-18 DIAGNOSIS — M47812 Spondylosis without myelopathy or radiculopathy, cervical region: Secondary | ICD-10-CM | POA: Diagnosis not present

## 2023-05-18 DIAGNOSIS — Z993 Dependence on wheelchair: Secondary | ICD-10-CM | POA: Diagnosis not present

## 2023-05-18 DIAGNOSIS — Z9181 History of falling: Secondary | ICD-10-CM | POA: Diagnosis not present

## 2023-05-18 DIAGNOSIS — M4802 Spinal stenosis, cervical region: Secondary | ICD-10-CM | POA: Diagnosis not present

## 2023-05-18 DIAGNOSIS — R32 Unspecified urinary incontinence: Secondary | ICD-10-CM | POA: Diagnosis not present

## 2023-05-18 DIAGNOSIS — Z8673 Personal history of transient ischemic attack (TIA), and cerebral infarction without residual deficits: Secondary | ICD-10-CM | POA: Diagnosis not present

## 2023-05-18 DIAGNOSIS — Z7984 Long term (current) use of oral hypoglycemic drugs: Secondary | ICD-10-CM | POA: Diagnosis not present

## 2023-05-18 DIAGNOSIS — E119 Type 2 diabetes mellitus without complications: Secondary | ICD-10-CM | POA: Diagnosis not present

## 2023-05-18 DIAGNOSIS — Z556 Problems related to health literacy: Secondary | ICD-10-CM | POA: Diagnosis not present

## 2023-05-18 DIAGNOSIS — I10 Essential (primary) hypertension: Secondary | ICD-10-CM | POA: Diagnosis not present

## 2023-05-22 ENCOUNTER — Telehealth: Payer: Self-pay | Admitting: Neurology

## 2023-05-22 DIAGNOSIS — I10 Essential (primary) hypertension: Secondary | ICD-10-CM | POA: Diagnosis not present

## 2023-05-22 DIAGNOSIS — I872 Venous insufficiency (chronic) (peripheral): Secondary | ICD-10-CM | POA: Diagnosis not present

## 2023-05-22 DIAGNOSIS — R600 Localized edema: Secondary | ICD-10-CM | POA: Diagnosis not present

## 2023-05-22 DIAGNOSIS — E1169 Type 2 diabetes mellitus with other specified complication: Secondary | ICD-10-CM | POA: Diagnosis not present

## 2023-05-22 NOTE — Telephone Encounter (Signed)
At 10:26 Vernona Rieger OT with [facility] left vm asking for verbal orders for 2 week 7 for OT her call back # is [phone]

## 2023-05-22 NOTE — Telephone Encounter (Signed)
I left a secure voicemail for Chloe Bradley OT with [facility] providing verbal orders for the patient.

## 2023-05-23 DIAGNOSIS — Z9181 History of falling: Secondary | ICD-10-CM | POA: Diagnosis not present

## 2023-05-23 DIAGNOSIS — E119 Type 2 diabetes mellitus without complications: Secondary | ICD-10-CM | POA: Diagnosis not present

## 2023-05-23 DIAGNOSIS — I10 Essential (primary) hypertension: Secondary | ICD-10-CM | POA: Diagnosis not present

## 2023-05-23 DIAGNOSIS — M4802 Spinal stenosis, cervical region: Secondary | ICD-10-CM | POA: Diagnosis not present

## 2023-05-23 DIAGNOSIS — R32 Unspecified urinary incontinence: Secondary | ICD-10-CM | POA: Diagnosis not present

## 2023-05-23 DIAGNOSIS — Z8673 Personal history of transient ischemic attack (TIA), and cerebral infarction without residual deficits: Secondary | ICD-10-CM | POA: Diagnosis not present

## 2023-05-23 DIAGNOSIS — Z7984 Long term (current) use of oral hypoglycemic drugs: Secondary | ICD-10-CM | POA: Diagnosis not present

## 2023-05-23 DIAGNOSIS — Z556 Problems related to health literacy: Secondary | ICD-10-CM | POA: Diagnosis not present

## 2023-05-23 DIAGNOSIS — Z87440 Personal history of urinary (tract) infections: Secondary | ICD-10-CM | POA: Diagnosis not present

## 2023-05-23 DIAGNOSIS — M47812 Spondylosis without myelopathy or radiculopathy, cervical region: Secondary | ICD-10-CM | POA: Diagnosis not present

## 2023-05-23 DIAGNOSIS — Z993 Dependence on wheelchair: Secondary | ICD-10-CM | POA: Diagnosis not present

## 2023-05-24 DIAGNOSIS — R32 Unspecified urinary incontinence: Secondary | ICD-10-CM | POA: Diagnosis not present

## 2023-05-24 DIAGNOSIS — M4802 Spinal stenosis, cervical region: Secondary | ICD-10-CM | POA: Diagnosis not present

## 2023-05-24 DIAGNOSIS — Z8673 Personal history of transient ischemic attack (TIA), and cerebral infarction without residual deficits: Secondary | ICD-10-CM | POA: Diagnosis not present

## 2023-05-24 DIAGNOSIS — Z87440 Personal history of urinary (tract) infections: Secondary | ICD-10-CM | POA: Diagnosis not present

## 2023-05-24 DIAGNOSIS — Z993 Dependence on wheelchair: Secondary | ICD-10-CM | POA: Diagnosis not present

## 2023-05-24 DIAGNOSIS — M47812 Spondylosis without myelopathy or radiculopathy, cervical region: Secondary | ICD-10-CM | POA: Diagnosis not present

## 2023-05-24 DIAGNOSIS — E119 Type 2 diabetes mellitus without complications: Secondary | ICD-10-CM | POA: Diagnosis not present

## 2023-05-24 DIAGNOSIS — Z556 Problems related to health literacy: Secondary | ICD-10-CM | POA: Diagnosis not present

## 2023-05-24 DIAGNOSIS — I10 Essential (primary) hypertension: Secondary | ICD-10-CM | POA: Diagnosis not present

## 2023-05-24 DIAGNOSIS — Z7984 Long term (current) use of oral hypoglycemic drugs: Secondary | ICD-10-CM | POA: Diagnosis not present

## 2023-05-24 DIAGNOSIS — Z9181 History of falling: Secondary | ICD-10-CM | POA: Diagnosis not present

## 2023-05-29 DIAGNOSIS — Z87440 Personal history of urinary (tract) infections: Secondary | ICD-10-CM | POA: Diagnosis not present

## 2023-05-29 DIAGNOSIS — E119 Type 2 diabetes mellitus without complications: Secondary | ICD-10-CM | POA: Diagnosis not present

## 2023-05-29 DIAGNOSIS — M47812 Spondylosis without myelopathy or radiculopathy, cervical region: Secondary | ICD-10-CM | POA: Diagnosis not present

## 2023-05-29 DIAGNOSIS — Z993 Dependence on wheelchair: Secondary | ICD-10-CM | POA: Diagnosis not present

## 2023-05-29 DIAGNOSIS — M4802 Spinal stenosis, cervical region: Secondary | ICD-10-CM | POA: Diagnosis not present

## 2023-05-29 DIAGNOSIS — I10 Essential (primary) hypertension: Secondary | ICD-10-CM | POA: Diagnosis not present

## 2023-05-29 DIAGNOSIS — Z8673 Personal history of transient ischemic attack (TIA), and cerebral infarction without residual deficits: Secondary | ICD-10-CM | POA: Diagnosis not present

## 2023-05-29 DIAGNOSIS — Z9181 History of falling: Secondary | ICD-10-CM | POA: Diagnosis not present

## 2023-05-29 DIAGNOSIS — Z7984 Long term (current) use of oral hypoglycemic drugs: Secondary | ICD-10-CM | POA: Diagnosis not present

## 2023-05-29 DIAGNOSIS — R32 Unspecified urinary incontinence: Secondary | ICD-10-CM | POA: Diagnosis not present

## 2023-05-29 DIAGNOSIS — Z556 Problems related to health literacy: Secondary | ICD-10-CM | POA: Diagnosis not present

## 2023-05-29 NOTE — Telephone Encounter (Signed)
 PT order faxed to [phone]. Confirmation received.

## 2023-05-30 ENCOUNTER — Encounter: Payer: Self-pay | Admitting: Neurology

## 2023-05-30 DIAGNOSIS — M4802 Spinal stenosis, cervical region: Secondary | ICD-10-CM | POA: Diagnosis not present

## 2023-05-30 DIAGNOSIS — Z993 Dependence on wheelchair: Secondary | ICD-10-CM | POA: Diagnosis not present

## 2023-05-30 DIAGNOSIS — E119 Type 2 diabetes mellitus without complications: Secondary | ICD-10-CM | POA: Diagnosis not present

## 2023-05-30 DIAGNOSIS — M47812 Spondylosis without myelopathy or radiculopathy, cervical region: Secondary | ICD-10-CM | POA: Diagnosis not present

## 2023-05-30 DIAGNOSIS — Z8673 Personal history of transient ischemic attack (TIA), and cerebral infarction without residual deficits: Secondary | ICD-10-CM | POA: Diagnosis not present

## 2023-05-30 DIAGNOSIS — Z87440 Personal history of urinary (tract) infections: Secondary | ICD-10-CM | POA: Diagnosis not present

## 2023-05-30 DIAGNOSIS — Z556 Problems related to health literacy: Secondary | ICD-10-CM | POA: Diagnosis not present

## 2023-05-30 DIAGNOSIS — R32 Unspecified urinary incontinence: Secondary | ICD-10-CM | POA: Diagnosis not present

## 2023-05-30 DIAGNOSIS — I10 Essential (primary) hypertension: Secondary | ICD-10-CM | POA: Diagnosis not present

## 2023-05-30 DIAGNOSIS — Z7984 Long term (current) use of oral hypoglycemic drugs: Secondary | ICD-10-CM | POA: Diagnosis not present

## 2023-05-30 DIAGNOSIS — Z9181 History of falling: Secondary | ICD-10-CM | POA: Diagnosis not present

## 2023-05-30 NOTE — Telephone Encounter (Signed)
 PT order faxed to [phone]. Confirmation received.

## 2023-05-31 DIAGNOSIS — I10 Essential (primary) hypertension: Secondary | ICD-10-CM | POA: Diagnosis not present

## 2023-05-31 DIAGNOSIS — Z556 Problems related to health literacy: Secondary | ICD-10-CM | POA: Diagnosis not present

## 2023-05-31 DIAGNOSIS — M47812 Spondylosis without myelopathy or radiculopathy, cervical region: Secondary | ICD-10-CM | POA: Diagnosis not present

## 2023-05-31 DIAGNOSIS — E119 Type 2 diabetes mellitus without complications: Secondary | ICD-10-CM | POA: Diagnosis not present

## 2023-05-31 DIAGNOSIS — Z9181 History of falling: Secondary | ICD-10-CM | POA: Diagnosis not present

## 2023-05-31 DIAGNOSIS — M4802 Spinal stenosis, cervical region: Secondary | ICD-10-CM | POA: Diagnosis not present

## 2023-05-31 DIAGNOSIS — R32 Unspecified urinary incontinence: Secondary | ICD-10-CM | POA: Diagnosis not present

## 2023-05-31 DIAGNOSIS — Z7984 Long term (current) use of oral hypoglycemic drugs: Secondary | ICD-10-CM | POA: Diagnosis not present

## 2023-05-31 DIAGNOSIS — Z993 Dependence on wheelchair: Secondary | ICD-10-CM | POA: Diagnosis not present

## 2023-05-31 DIAGNOSIS — Z87440 Personal history of urinary (tract) infections: Secondary | ICD-10-CM | POA: Diagnosis not present

## 2023-05-31 DIAGNOSIS — Z8673 Personal history of transient ischemic attack (TIA), and cerebral infarction without residual deficits: Secondary | ICD-10-CM | POA: Diagnosis not present

## 2023-06-05 DIAGNOSIS — Z556 Problems related to health literacy: Secondary | ICD-10-CM | POA: Diagnosis not present

## 2023-06-05 DIAGNOSIS — Z9181 History of falling: Secondary | ICD-10-CM | POA: Diagnosis not present

## 2023-06-05 DIAGNOSIS — Z7984 Long term (current) use of oral hypoglycemic drugs: Secondary | ICD-10-CM | POA: Diagnosis not present

## 2023-06-05 DIAGNOSIS — I10 Essential (primary) hypertension: Secondary | ICD-10-CM | POA: Diagnosis not present

## 2023-06-05 DIAGNOSIS — Z8673 Personal history of transient ischemic attack (TIA), and cerebral infarction without residual deficits: Secondary | ICD-10-CM | POA: Diagnosis not present

## 2023-06-05 DIAGNOSIS — E119 Type 2 diabetes mellitus without complications: Secondary | ICD-10-CM | POA: Diagnosis not present

## 2023-06-05 DIAGNOSIS — Z87440 Personal history of urinary (tract) infections: Secondary | ICD-10-CM | POA: Diagnosis not present

## 2023-06-05 DIAGNOSIS — R32 Unspecified urinary incontinence: Secondary | ICD-10-CM | POA: Diagnosis not present

## 2023-06-05 DIAGNOSIS — M4802 Spinal stenosis, cervical region: Secondary | ICD-10-CM | POA: Diagnosis not present

## 2023-06-05 DIAGNOSIS — M47812 Spondylosis without myelopathy or radiculopathy, cervical region: Secondary | ICD-10-CM | POA: Diagnosis not present

## 2023-06-05 DIAGNOSIS — Z993 Dependence on wheelchair: Secondary | ICD-10-CM | POA: Diagnosis not present

## 2023-06-06 DIAGNOSIS — M25532 Pain in left wrist: Secondary | ICD-10-CM | POA: Diagnosis not present

## 2023-06-07 DIAGNOSIS — I10 Essential (primary) hypertension: Secondary | ICD-10-CM | POA: Diagnosis not present

## 2023-06-07 DIAGNOSIS — Z87440 Personal history of urinary (tract) infections: Secondary | ICD-10-CM | POA: Diagnosis not present

## 2023-06-07 DIAGNOSIS — Z9181 History of falling: Secondary | ICD-10-CM | POA: Diagnosis not present

## 2023-06-07 DIAGNOSIS — Z993 Dependence on wheelchair: Secondary | ICD-10-CM | POA: Diagnosis not present

## 2023-06-07 DIAGNOSIS — M4802 Spinal stenosis, cervical region: Secondary | ICD-10-CM | POA: Diagnosis not present

## 2023-06-07 DIAGNOSIS — R32 Unspecified urinary incontinence: Secondary | ICD-10-CM | POA: Diagnosis not present

## 2023-06-07 DIAGNOSIS — M47812 Spondylosis without myelopathy or radiculopathy, cervical region: Secondary | ICD-10-CM | POA: Diagnosis not present

## 2023-06-07 DIAGNOSIS — E119 Type 2 diabetes mellitus without complications: Secondary | ICD-10-CM | POA: Diagnosis not present

## 2023-06-07 DIAGNOSIS — Z556 Problems related to health literacy: Secondary | ICD-10-CM | POA: Diagnosis not present

## 2023-06-07 DIAGNOSIS — Z7984 Long term (current) use of oral hypoglycemic drugs: Secondary | ICD-10-CM | POA: Diagnosis not present

## 2023-06-07 DIAGNOSIS — Z8673 Personal history of transient ischemic attack (TIA), and cerebral infarction without residual deficits: Secondary | ICD-10-CM | POA: Diagnosis not present

## 2023-06-11 DIAGNOSIS — R32 Unspecified urinary incontinence: Secondary | ICD-10-CM | POA: Diagnosis not present

## 2023-06-11 DIAGNOSIS — E119 Type 2 diabetes mellitus without complications: Secondary | ICD-10-CM | POA: Diagnosis not present

## 2023-06-11 DIAGNOSIS — Z993 Dependence on wheelchair: Secondary | ICD-10-CM | POA: Diagnosis not present

## 2023-06-11 DIAGNOSIS — Z87440 Personal history of urinary (tract) infections: Secondary | ICD-10-CM | POA: Diagnosis not present

## 2023-06-11 DIAGNOSIS — Z556 Problems related to health literacy: Secondary | ICD-10-CM | POA: Diagnosis not present

## 2023-06-11 DIAGNOSIS — I10 Essential (primary) hypertension: Secondary | ICD-10-CM | POA: Diagnosis not present

## 2023-06-11 DIAGNOSIS — M4802 Spinal stenosis, cervical region: Secondary | ICD-10-CM | POA: Diagnosis not present

## 2023-06-11 DIAGNOSIS — M47812 Spondylosis without myelopathy or radiculopathy, cervical region: Secondary | ICD-10-CM | POA: Diagnosis not present

## 2023-06-11 DIAGNOSIS — Z8673 Personal history of transient ischemic attack (TIA), and cerebral infarction without residual deficits: Secondary | ICD-10-CM | POA: Diagnosis not present

## 2023-06-11 DIAGNOSIS — Z9181 History of falling: Secondary | ICD-10-CM | POA: Diagnosis not present

## 2023-06-11 DIAGNOSIS — Z7984 Long term (current) use of oral hypoglycemic drugs: Secondary | ICD-10-CM | POA: Diagnosis not present

## 2023-06-12 DIAGNOSIS — Z7984 Long term (current) use of oral hypoglycemic drugs: Secondary | ICD-10-CM | POA: Diagnosis not present

## 2023-06-12 DIAGNOSIS — I10 Essential (primary) hypertension: Secondary | ICD-10-CM | POA: Diagnosis not present

## 2023-06-12 DIAGNOSIS — Z9181 History of falling: Secondary | ICD-10-CM | POA: Diagnosis not present

## 2023-06-12 DIAGNOSIS — M47812 Spondylosis without myelopathy or radiculopathy, cervical region: Secondary | ICD-10-CM | POA: Diagnosis not present

## 2023-06-12 DIAGNOSIS — E119 Type 2 diabetes mellitus without complications: Secondary | ICD-10-CM | POA: Diagnosis not present

## 2023-06-12 DIAGNOSIS — Z8673 Personal history of transient ischemic attack (TIA), and cerebral infarction without residual deficits: Secondary | ICD-10-CM | POA: Diagnosis not present

## 2023-06-12 DIAGNOSIS — Z87440 Personal history of urinary (tract) infections: Secondary | ICD-10-CM | POA: Diagnosis not present

## 2023-06-12 DIAGNOSIS — Z993 Dependence on wheelchair: Secondary | ICD-10-CM | POA: Diagnosis not present

## 2023-06-12 DIAGNOSIS — R32 Unspecified urinary incontinence: Secondary | ICD-10-CM | POA: Diagnosis not present

## 2023-06-12 DIAGNOSIS — M4802 Spinal stenosis, cervical region: Secondary | ICD-10-CM | POA: Diagnosis not present

## 2023-06-12 DIAGNOSIS — Z556 Problems related to health literacy: Secondary | ICD-10-CM | POA: Diagnosis not present

## 2023-06-13 ENCOUNTER — Other Ambulatory Visit: Payer: Medicare HMO

## 2023-06-15 DIAGNOSIS — Z7984 Long term (current) use of oral hypoglycemic drugs: Secondary | ICD-10-CM | POA: Diagnosis not present

## 2023-06-15 DIAGNOSIS — I10 Essential (primary) hypertension: Secondary | ICD-10-CM | POA: Diagnosis not present

## 2023-06-15 DIAGNOSIS — M4802 Spinal stenosis, cervical region: Secondary | ICD-10-CM | POA: Diagnosis not present

## 2023-06-15 DIAGNOSIS — Z993 Dependence on wheelchair: Secondary | ICD-10-CM | POA: Diagnosis not present

## 2023-06-15 DIAGNOSIS — Z556 Problems related to health literacy: Secondary | ICD-10-CM | POA: Diagnosis not present

## 2023-06-15 DIAGNOSIS — Z9181 History of falling: Secondary | ICD-10-CM | POA: Diagnosis not present

## 2023-06-15 DIAGNOSIS — Z8673 Personal history of transient ischemic attack (TIA), and cerebral infarction without residual deficits: Secondary | ICD-10-CM | POA: Diagnosis not present

## 2023-06-15 DIAGNOSIS — M47812 Spondylosis without myelopathy or radiculopathy, cervical region: Secondary | ICD-10-CM | POA: Diagnosis not present

## 2023-06-15 DIAGNOSIS — R32 Unspecified urinary incontinence: Secondary | ICD-10-CM | POA: Diagnosis not present

## 2023-06-15 DIAGNOSIS — Z87440 Personal history of urinary (tract) infections: Secondary | ICD-10-CM | POA: Diagnosis not present

## 2023-06-15 DIAGNOSIS — E119 Type 2 diabetes mellitus without complications: Secondary | ICD-10-CM | POA: Diagnosis not present

## 2023-06-18 DIAGNOSIS — E1169 Type 2 diabetes mellitus with other specified complication: Secondary | ICD-10-CM | POA: Diagnosis not present

## 2023-06-18 DIAGNOSIS — M4802 Spinal stenosis, cervical region: Secondary | ICD-10-CM | POA: Diagnosis not present

## 2023-06-18 DIAGNOSIS — I10 Essential (primary) hypertension: Secondary | ICD-10-CM | POA: Diagnosis not present

## 2023-06-18 DIAGNOSIS — R531 Weakness: Secondary | ICD-10-CM | POA: Diagnosis not present

## 2023-06-18 DIAGNOSIS — Z556 Problems related to health literacy: Secondary | ICD-10-CM | POA: Diagnosis not present

## 2023-06-18 DIAGNOSIS — E119 Type 2 diabetes mellitus without complications: Secondary | ICD-10-CM | POA: Diagnosis not present

## 2023-06-18 DIAGNOSIS — Z8673 Personal history of transient ischemic attack (TIA), and cerebral infarction without residual deficits: Secondary | ICD-10-CM | POA: Diagnosis not present

## 2023-06-18 DIAGNOSIS — Z9181 History of falling: Secondary | ICD-10-CM | POA: Diagnosis not present

## 2023-06-18 DIAGNOSIS — K269 Duodenal ulcer, unspecified as acute or chronic, without hemorrhage or perforation: Secondary | ICD-10-CM | POA: Diagnosis not present

## 2023-06-18 DIAGNOSIS — Z7984 Long term (current) use of oral hypoglycemic drugs: Secondary | ICD-10-CM | POA: Diagnosis not present

## 2023-06-18 DIAGNOSIS — R32 Unspecified urinary incontinence: Secondary | ICD-10-CM | POA: Diagnosis not present

## 2023-06-18 DIAGNOSIS — R269 Unspecified abnormalities of gait and mobility: Secondary | ICD-10-CM | POA: Diagnosis not present

## 2023-06-18 DIAGNOSIS — E1165 Type 2 diabetes mellitus with hyperglycemia: Secondary | ICD-10-CM | POA: Diagnosis not present

## 2023-06-18 DIAGNOSIS — M47812 Spondylosis without myelopathy or radiculopathy, cervical region: Secondary | ICD-10-CM | POA: Diagnosis not present

## 2023-06-18 DIAGNOSIS — Z87440 Personal history of urinary (tract) infections: Secondary | ICD-10-CM | POA: Diagnosis not present

## 2023-06-18 DIAGNOSIS — Z993 Dependence on wheelchair: Secondary | ICD-10-CM | POA: Diagnosis not present

## 2023-06-19 DIAGNOSIS — Z7984 Long term (current) use of oral hypoglycemic drugs: Secondary | ICD-10-CM | POA: Diagnosis not present

## 2023-06-19 DIAGNOSIS — Z8673 Personal history of transient ischemic attack (TIA), and cerebral infarction without residual deficits: Secondary | ICD-10-CM | POA: Diagnosis not present

## 2023-06-19 DIAGNOSIS — Z993 Dependence on wheelchair: Secondary | ICD-10-CM | POA: Diagnosis not present

## 2023-06-19 DIAGNOSIS — E119 Type 2 diabetes mellitus without complications: Secondary | ICD-10-CM | POA: Diagnosis not present

## 2023-06-19 DIAGNOSIS — I10 Essential (primary) hypertension: Secondary | ICD-10-CM | POA: Diagnosis not present

## 2023-06-19 DIAGNOSIS — M4802 Spinal stenosis, cervical region: Secondary | ICD-10-CM | POA: Diagnosis not present

## 2023-06-19 DIAGNOSIS — Z9181 History of falling: Secondary | ICD-10-CM | POA: Diagnosis not present

## 2023-06-19 DIAGNOSIS — M47812 Spondylosis without myelopathy or radiculopathy, cervical region: Secondary | ICD-10-CM | POA: Diagnosis not present

## 2023-06-19 DIAGNOSIS — Z87440 Personal history of urinary (tract) infections: Secondary | ICD-10-CM | POA: Diagnosis not present

## 2023-06-19 DIAGNOSIS — R32 Unspecified urinary incontinence: Secondary | ICD-10-CM | POA: Diagnosis not present

## 2023-06-19 DIAGNOSIS — Z556 Problems related to health literacy: Secondary | ICD-10-CM | POA: Diagnosis not present

## 2023-06-21 DIAGNOSIS — Z87440 Personal history of urinary (tract) infections: Secondary | ICD-10-CM | POA: Diagnosis not present

## 2023-06-21 DIAGNOSIS — E119 Type 2 diabetes mellitus without complications: Secondary | ICD-10-CM | POA: Diagnosis not present

## 2023-06-21 DIAGNOSIS — Z993 Dependence on wheelchair: Secondary | ICD-10-CM | POA: Diagnosis not present

## 2023-06-21 DIAGNOSIS — Z9181 History of falling: Secondary | ICD-10-CM | POA: Diagnosis not present

## 2023-06-21 DIAGNOSIS — M4802 Spinal stenosis, cervical region: Secondary | ICD-10-CM | POA: Diagnosis not present

## 2023-06-21 DIAGNOSIS — Z7984 Long term (current) use of oral hypoglycemic drugs: Secondary | ICD-10-CM | POA: Diagnosis not present

## 2023-06-21 DIAGNOSIS — Z556 Problems related to health literacy: Secondary | ICD-10-CM | POA: Diagnosis not present

## 2023-06-21 DIAGNOSIS — Z8673 Personal history of transient ischemic attack (TIA), and cerebral infarction without residual deficits: Secondary | ICD-10-CM | POA: Diagnosis not present

## 2023-06-21 DIAGNOSIS — M47812 Spondylosis without myelopathy or radiculopathy, cervical region: Secondary | ICD-10-CM | POA: Diagnosis not present

## 2023-06-21 DIAGNOSIS — R32 Unspecified urinary incontinence: Secondary | ICD-10-CM | POA: Diagnosis not present

## 2023-06-21 DIAGNOSIS — I10 Essential (primary) hypertension: Secondary | ICD-10-CM | POA: Diagnosis not present

## 2023-06-25 DIAGNOSIS — R32 Unspecified urinary incontinence: Secondary | ICD-10-CM | POA: Diagnosis not present

## 2023-06-25 DIAGNOSIS — Z8673 Personal history of transient ischemic attack (TIA), and cerebral infarction without residual deficits: Secondary | ICD-10-CM | POA: Diagnosis not present

## 2023-06-25 DIAGNOSIS — M4802 Spinal stenosis, cervical region: Secondary | ICD-10-CM | POA: Diagnosis not present

## 2023-06-25 DIAGNOSIS — Z556 Problems related to health literacy: Secondary | ICD-10-CM | POA: Diagnosis not present

## 2023-06-25 DIAGNOSIS — M47812 Spondylosis without myelopathy or radiculopathy, cervical region: Secondary | ICD-10-CM | POA: Diagnosis not present

## 2023-06-25 DIAGNOSIS — Z9181 History of falling: Secondary | ICD-10-CM | POA: Diagnosis not present

## 2023-06-25 DIAGNOSIS — Z87440 Personal history of urinary (tract) infections: Secondary | ICD-10-CM | POA: Diagnosis not present

## 2023-06-25 DIAGNOSIS — Z7984 Long term (current) use of oral hypoglycemic drugs: Secondary | ICD-10-CM | POA: Diagnosis not present

## 2023-06-25 DIAGNOSIS — I10 Essential (primary) hypertension: Secondary | ICD-10-CM | POA: Diagnosis not present

## 2023-06-25 DIAGNOSIS — E119 Type 2 diabetes mellitus without complications: Secondary | ICD-10-CM | POA: Diagnosis not present

## 2023-06-25 DIAGNOSIS — Z993 Dependence on wheelchair: Secondary | ICD-10-CM | POA: Diagnosis not present

## 2023-06-27 DIAGNOSIS — Z9181 History of falling: Secondary | ICD-10-CM | POA: Diagnosis not present

## 2023-06-27 DIAGNOSIS — Z87440 Personal history of urinary (tract) infections: Secondary | ICD-10-CM | POA: Diagnosis not present

## 2023-06-27 DIAGNOSIS — Z993 Dependence on wheelchair: Secondary | ICD-10-CM | POA: Diagnosis not present

## 2023-06-27 DIAGNOSIS — Z8673 Personal history of transient ischemic attack (TIA), and cerebral infarction without residual deficits: Secondary | ICD-10-CM | POA: Diagnosis not present

## 2023-06-27 DIAGNOSIS — E119 Type 2 diabetes mellitus without complications: Secondary | ICD-10-CM | POA: Diagnosis not present

## 2023-06-27 DIAGNOSIS — M4802 Spinal stenosis, cervical region: Secondary | ICD-10-CM | POA: Diagnosis not present

## 2023-06-27 DIAGNOSIS — Z556 Problems related to health literacy: Secondary | ICD-10-CM | POA: Diagnosis not present

## 2023-06-27 DIAGNOSIS — Z7984 Long term (current) use of oral hypoglycemic drugs: Secondary | ICD-10-CM | POA: Diagnosis not present

## 2023-06-27 DIAGNOSIS — M47812 Spondylosis without myelopathy or radiculopathy, cervical region: Secondary | ICD-10-CM | POA: Diagnosis not present

## 2023-06-27 DIAGNOSIS — R32 Unspecified urinary incontinence: Secondary | ICD-10-CM | POA: Diagnosis not present

## 2023-06-27 DIAGNOSIS — I10 Essential (primary) hypertension: Secondary | ICD-10-CM | POA: Diagnosis not present

## 2023-06-28 DIAGNOSIS — E119 Type 2 diabetes mellitus without complications: Secondary | ICD-10-CM | POA: Diagnosis not present

## 2023-06-28 DIAGNOSIS — Z87440 Personal history of urinary (tract) infections: Secondary | ICD-10-CM | POA: Diagnosis not present

## 2023-06-28 DIAGNOSIS — Z993 Dependence on wheelchair: Secondary | ICD-10-CM | POA: Diagnosis not present

## 2023-06-28 DIAGNOSIS — Z9181 History of falling: Secondary | ICD-10-CM | POA: Diagnosis not present

## 2023-06-28 DIAGNOSIS — Z556 Problems related to health literacy: Secondary | ICD-10-CM | POA: Diagnosis not present

## 2023-06-28 DIAGNOSIS — M47812 Spondylosis without myelopathy or radiculopathy, cervical region: Secondary | ICD-10-CM | POA: Diagnosis not present

## 2023-06-28 DIAGNOSIS — M4802 Spinal stenosis, cervical region: Secondary | ICD-10-CM | POA: Diagnosis not present

## 2023-06-28 DIAGNOSIS — R32 Unspecified urinary incontinence: Secondary | ICD-10-CM | POA: Diagnosis not present

## 2023-06-28 DIAGNOSIS — Z7984 Long term (current) use of oral hypoglycemic drugs: Secondary | ICD-10-CM | POA: Diagnosis not present

## 2023-06-28 DIAGNOSIS — I10 Essential (primary) hypertension: Secondary | ICD-10-CM | POA: Diagnosis not present

## 2023-06-28 DIAGNOSIS — Z8673 Personal history of transient ischemic attack (TIA), and cerebral infarction without residual deficits: Secondary | ICD-10-CM | POA: Diagnosis not present

## 2023-07-02 ENCOUNTER — Ambulatory Visit
Admission: RE | Admit: 2023-07-02 | Discharge: 2023-07-02 | Disposition: A | Discharge status: Home / Self Care | Payer: Medicare HMO | Source: Ambulatory Visit | Attending: Neurology | Admitting: Neurology

## 2023-07-02 DIAGNOSIS — M47812 Spondylosis without myelopathy or radiculopathy, cervical region: Secondary | ICD-10-CM

## 2023-07-02 DIAGNOSIS — R269 Unspecified abnormalities of gait and mobility: Secondary | ICD-10-CM

## 2023-07-02 DIAGNOSIS — I679 Cerebrovascular disease, unspecified: Secondary | ICD-10-CM

## 2023-07-03 DIAGNOSIS — E1165 Type 2 diabetes mellitus with hyperglycemia: Secondary | ICD-10-CM | POA: Diagnosis not present

## 2023-07-03 DIAGNOSIS — R269 Unspecified abnormalities of gait and mobility: Secondary | ICD-10-CM | POA: Diagnosis not present

## 2023-07-03 DIAGNOSIS — R531 Weakness: Secondary | ICD-10-CM | POA: Diagnosis not present

## 2023-07-03 DIAGNOSIS — I10 Essential (primary) hypertension: Secondary | ICD-10-CM | POA: Diagnosis not present

## 2023-07-04 ENCOUNTER — Telehealth: Payer: Self-pay

## 2023-07-04 DIAGNOSIS — Z87440 Personal history of urinary (tract) infections: Secondary | ICD-10-CM | POA: Diagnosis not present

## 2023-07-04 DIAGNOSIS — M4802 Spinal stenosis, cervical region: Secondary | ICD-10-CM | POA: Diagnosis not present

## 2023-07-04 DIAGNOSIS — Z9181 History of falling: Secondary | ICD-10-CM | POA: Diagnosis not present

## 2023-07-04 DIAGNOSIS — I10 Essential (primary) hypertension: Secondary | ICD-10-CM | POA: Diagnosis not present

## 2023-07-04 DIAGNOSIS — R32 Unspecified urinary incontinence: Secondary | ICD-10-CM | POA: Diagnosis not present

## 2023-07-04 DIAGNOSIS — Z8673 Personal history of transient ischemic attack (TIA), and cerebral infarction without residual deficits: Secondary | ICD-10-CM | POA: Diagnosis not present

## 2023-07-04 DIAGNOSIS — Z556 Problems related to health literacy: Secondary | ICD-10-CM | POA: Diagnosis not present

## 2023-07-04 DIAGNOSIS — E119 Type 2 diabetes mellitus without complications: Secondary | ICD-10-CM | POA: Diagnosis not present

## 2023-07-04 DIAGNOSIS — M47812 Spondylosis without myelopathy or radiculopathy, cervical region: Secondary | ICD-10-CM | POA: Diagnosis not present

## 2023-07-04 DIAGNOSIS — Z993 Dependence on wheelchair: Secondary | ICD-10-CM | POA: Diagnosis not present

## 2023-07-04 DIAGNOSIS — Z7984 Long term (current) use of oral hypoglycemic drugs: Secondary | ICD-10-CM | POA: Diagnosis not present

## 2023-07-04 NOTE — Telephone Encounter (Signed)
-----   Message from Glenford Bayley [facility] sent at 07/04/2023  3:33 PM EDT ----- Severe spinal stenosis at C6-7 may explain gait difficulty.  Consider neurosurgery consult if patient is surgical candidate.  -VRP

## 2023-07-04 NOTE — Progress Notes (Signed)
 Severe spinal stenosis at C6-7 may explain gait difficulty.  Consider neurosurgery consult if patient is surgical candidate.  -VRP

## 2023-07-04 NOTE — Progress Notes (Signed)
 Results are good, no major findings.  Some chronic changes noted related to small vessel disease and atrophy.  Continue current plan. -VRP

## 2023-07-04 NOTE — Telephone Encounter (Signed)
 Call to daughter , she added patient in on call and was given permission to speak with daughter. Reviewed results and they verbalized understanding. She states she will talk with family and let us know if they would want a neurosurgery referral

## 2023-07-05 DIAGNOSIS — Z7984 Long term (current) use of oral hypoglycemic drugs: Secondary | ICD-10-CM | POA: Diagnosis not present

## 2023-07-05 DIAGNOSIS — E119 Type 2 diabetes mellitus without complications: Secondary | ICD-10-CM | POA: Diagnosis not present

## 2023-07-05 DIAGNOSIS — M47812 Spondylosis without myelopathy or radiculopathy, cervical region: Secondary | ICD-10-CM | POA: Diagnosis not present

## 2023-07-05 DIAGNOSIS — R32 Unspecified urinary incontinence: Secondary | ICD-10-CM | POA: Diagnosis not present

## 2023-07-05 DIAGNOSIS — Z993 Dependence on wheelchair: Secondary | ICD-10-CM | POA: Diagnosis not present

## 2023-07-05 DIAGNOSIS — Z556 Problems related to health literacy: Secondary | ICD-10-CM | POA: Diagnosis not present

## 2023-07-05 DIAGNOSIS — Z9181 History of falling: Secondary | ICD-10-CM | POA: Diagnosis not present

## 2023-07-05 DIAGNOSIS — M4802 Spinal stenosis, cervical region: Secondary | ICD-10-CM | POA: Diagnosis not present

## 2023-07-05 DIAGNOSIS — Z87440 Personal history of urinary (tract) infections: Secondary | ICD-10-CM | POA: Diagnosis not present

## 2023-07-05 DIAGNOSIS — I10 Essential (primary) hypertension: Secondary | ICD-10-CM | POA: Diagnosis not present

## 2023-07-05 DIAGNOSIS — Z8673 Personal history of transient ischemic attack (TIA), and cerebral infarction without residual deficits: Secondary | ICD-10-CM | POA: Diagnosis not present

## 2023-07-06 ENCOUNTER — Telehealth: Payer: Self-pay | Admitting: Neurology

## 2023-07-06 NOTE — Telephone Encounter (Signed)
 Returned call to [employer] [facility] and left a secure voicemail providing verbal orders for the patient.

## 2023-07-06 NOTE — Telephone Encounter (Signed)
 Chloe Bradley from [facility] called requesting VO for PT with the frequency of 2x 4w 1x 5w

## 2023-07-09 DIAGNOSIS — Z993 Dependence on wheelchair: Secondary | ICD-10-CM | POA: Diagnosis not present

## 2023-07-09 DIAGNOSIS — E119 Type 2 diabetes mellitus without complications: Secondary | ICD-10-CM | POA: Diagnosis not present

## 2023-07-09 DIAGNOSIS — M4802 Spinal stenosis, cervical region: Secondary | ICD-10-CM | POA: Diagnosis not present

## 2023-07-09 DIAGNOSIS — M47812 Spondylosis without myelopathy or radiculopathy, cervical region: Secondary | ICD-10-CM | POA: Diagnosis not present

## 2023-07-09 DIAGNOSIS — Z7984 Long term (current) use of oral hypoglycemic drugs: Secondary | ICD-10-CM | POA: Diagnosis not present

## 2023-07-09 DIAGNOSIS — Z87440 Personal history of urinary (tract) infections: Secondary | ICD-10-CM | POA: Diagnosis not present

## 2023-07-09 DIAGNOSIS — R32 Unspecified urinary incontinence: Secondary | ICD-10-CM | POA: Diagnosis not present

## 2023-07-09 DIAGNOSIS — Z9181 History of falling: Secondary | ICD-10-CM | POA: Diagnosis not present

## 2023-07-09 DIAGNOSIS — I10 Essential (primary) hypertension: Secondary | ICD-10-CM | POA: Diagnosis not present

## 2023-07-09 DIAGNOSIS — Z8673 Personal history of transient ischemic attack (TIA), and cerebral infarction without residual deficits: Secondary | ICD-10-CM | POA: Diagnosis not present

## 2023-07-09 DIAGNOSIS — Z556 Problems related to health literacy: Secondary | ICD-10-CM | POA: Diagnosis not present

## 2023-07-11 DIAGNOSIS — I10 Essential (primary) hypertension: Secondary | ICD-10-CM | POA: Diagnosis not present

## 2023-07-11 DIAGNOSIS — M47812 Spondylosis without myelopathy or radiculopathy, cervical region: Secondary | ICD-10-CM | POA: Diagnosis not present

## 2023-07-11 DIAGNOSIS — Z87440 Personal history of urinary (tract) infections: Secondary | ICD-10-CM | POA: Diagnosis not present

## 2023-07-11 DIAGNOSIS — Z556 Problems related to health literacy: Secondary | ICD-10-CM | POA: Diagnosis not present

## 2023-07-11 DIAGNOSIS — Z8673 Personal history of transient ischemic attack (TIA), and cerebral infarction without residual deficits: Secondary | ICD-10-CM | POA: Diagnosis not present

## 2023-07-11 DIAGNOSIS — R32 Unspecified urinary incontinence: Secondary | ICD-10-CM | POA: Diagnosis not present

## 2023-07-11 DIAGNOSIS — M4802 Spinal stenosis, cervical region: Secondary | ICD-10-CM | POA: Diagnosis not present

## 2023-07-11 DIAGNOSIS — E119 Type 2 diabetes mellitus without complications: Secondary | ICD-10-CM | POA: Diagnosis not present

## 2023-07-11 DIAGNOSIS — Z993 Dependence on wheelchair: Secondary | ICD-10-CM | POA: Diagnosis not present

## 2023-07-11 DIAGNOSIS — Z9181 History of falling: Secondary | ICD-10-CM | POA: Diagnosis not present

## 2023-07-11 DIAGNOSIS — Z7984 Long term (current) use of oral hypoglycemic drugs: Secondary | ICD-10-CM | POA: Diagnosis not present

## 2023-07-12 DIAGNOSIS — M47812 Spondylosis without myelopathy or radiculopathy, cervical region: Secondary | ICD-10-CM | POA: Diagnosis not present

## 2023-07-12 DIAGNOSIS — M4802 Spinal stenosis, cervical region: Secondary | ICD-10-CM | POA: Diagnosis not present

## 2023-07-12 DIAGNOSIS — Z9181 History of falling: Secondary | ICD-10-CM | POA: Diagnosis not present

## 2023-07-12 DIAGNOSIS — Z993 Dependence on wheelchair: Secondary | ICD-10-CM | POA: Diagnosis not present

## 2023-07-12 DIAGNOSIS — Z7984 Long term (current) use of oral hypoglycemic drugs: Secondary | ICD-10-CM | POA: Diagnosis not present

## 2023-07-12 DIAGNOSIS — Z8673 Personal history of transient ischemic attack (TIA), and cerebral infarction without residual deficits: Secondary | ICD-10-CM | POA: Diagnosis not present

## 2023-07-12 DIAGNOSIS — Z556 Problems related to health literacy: Secondary | ICD-10-CM | POA: Diagnosis not present

## 2023-07-12 DIAGNOSIS — R32 Unspecified urinary incontinence: Secondary | ICD-10-CM | POA: Diagnosis not present

## 2023-07-12 DIAGNOSIS — Z87440 Personal history of urinary (tract) infections: Secondary | ICD-10-CM | POA: Diagnosis not present

## 2023-07-12 DIAGNOSIS — I10 Essential (primary) hypertension: Secondary | ICD-10-CM | POA: Diagnosis not present

## 2023-07-12 DIAGNOSIS — E119 Type 2 diabetes mellitus without complications: Secondary | ICD-10-CM | POA: Diagnosis not present

## 2023-07-16 ENCOUNTER — Telehealth: Payer: Self-pay

## 2023-07-16 NOTE — Telephone Encounter (Signed)
 Home health orders faxed to [facility]

## 2023-07-17 DIAGNOSIS — Z8673 Personal history of transient ischemic attack (TIA), and cerebral infarction without residual deficits: Secondary | ICD-10-CM | POA: Diagnosis not present

## 2023-07-17 DIAGNOSIS — Z87440 Personal history of urinary (tract) infections: Secondary | ICD-10-CM | POA: Diagnosis not present

## 2023-07-17 DIAGNOSIS — Z7984 Long term (current) use of oral hypoglycemic drugs: Secondary | ICD-10-CM | POA: Diagnosis not present

## 2023-07-17 DIAGNOSIS — M4802 Spinal stenosis, cervical region: Secondary | ICD-10-CM | POA: Diagnosis not present

## 2023-07-17 DIAGNOSIS — Z993 Dependence on wheelchair: Secondary | ICD-10-CM | POA: Diagnosis not present

## 2023-07-17 DIAGNOSIS — R32 Unspecified urinary incontinence: Secondary | ICD-10-CM | POA: Diagnosis not present

## 2023-07-17 DIAGNOSIS — Z9181 History of falling: Secondary | ICD-10-CM | POA: Diagnosis not present

## 2023-07-17 DIAGNOSIS — Z556 Problems related to health literacy: Secondary | ICD-10-CM | POA: Diagnosis not present

## 2023-07-17 DIAGNOSIS — I10 Essential (primary) hypertension: Secondary | ICD-10-CM | POA: Diagnosis not present

## 2023-07-17 DIAGNOSIS — E119 Type 2 diabetes mellitus without complications: Secondary | ICD-10-CM | POA: Diagnosis not present

## 2023-07-17 DIAGNOSIS — M47812 Spondylosis without myelopathy or radiculopathy, cervical region: Secondary | ICD-10-CM | POA: Diagnosis not present

## 2023-07-18 ENCOUNTER — Ambulatory Visit: Payer: Medicare HMO | Admitting: Podiatry

## 2023-07-18 DIAGNOSIS — Z556 Problems related to health literacy: Secondary | ICD-10-CM | POA: Diagnosis not present

## 2023-07-18 DIAGNOSIS — Z8673 Personal history of transient ischemic attack (TIA), and cerebral infarction without residual deficits: Secondary | ICD-10-CM | POA: Diagnosis not present

## 2023-07-18 DIAGNOSIS — I10 Essential (primary) hypertension: Secondary | ICD-10-CM | POA: Diagnosis not present

## 2023-07-18 DIAGNOSIS — E119 Type 2 diabetes mellitus without complications: Secondary | ICD-10-CM | POA: Diagnosis not present

## 2023-07-18 DIAGNOSIS — Z7984 Long term (current) use of oral hypoglycemic drugs: Secondary | ICD-10-CM | POA: Diagnosis not present

## 2023-07-18 DIAGNOSIS — M47812 Spondylosis without myelopathy or radiculopathy, cervical region: Secondary | ICD-10-CM | POA: Diagnosis not present

## 2023-07-18 DIAGNOSIS — Z993 Dependence on wheelchair: Secondary | ICD-10-CM | POA: Diagnosis not present

## 2023-07-18 DIAGNOSIS — Z9181 History of falling: Secondary | ICD-10-CM | POA: Diagnosis not present

## 2023-07-18 DIAGNOSIS — R32 Unspecified urinary incontinence: Secondary | ICD-10-CM | POA: Diagnosis not present

## 2023-07-18 DIAGNOSIS — M4802 Spinal stenosis, cervical region: Secondary | ICD-10-CM | POA: Diagnosis not present

## 2023-07-18 DIAGNOSIS — Z87440 Personal history of urinary (tract) infections: Secondary | ICD-10-CM | POA: Diagnosis not present

## 2023-07-20 DIAGNOSIS — Z993 Dependence on wheelchair: Secondary | ICD-10-CM | POA: Diagnosis not present

## 2023-07-20 DIAGNOSIS — Z7984 Long term (current) use of oral hypoglycemic drugs: Secondary | ICD-10-CM | POA: Diagnosis not present

## 2023-07-20 DIAGNOSIS — I10 Essential (primary) hypertension: Secondary | ICD-10-CM | POA: Diagnosis not present

## 2023-07-20 DIAGNOSIS — R32 Unspecified urinary incontinence: Secondary | ICD-10-CM | POA: Diagnosis not present

## 2023-07-20 DIAGNOSIS — Z87440 Personal history of urinary (tract) infections: Secondary | ICD-10-CM | POA: Diagnosis not present

## 2023-07-20 DIAGNOSIS — Z8673 Personal history of transient ischemic attack (TIA), and cerebral infarction without residual deficits: Secondary | ICD-10-CM | POA: Diagnosis not present

## 2023-07-20 DIAGNOSIS — Z9181 History of falling: Secondary | ICD-10-CM | POA: Diagnosis not present

## 2023-07-20 DIAGNOSIS — M47812 Spondylosis without myelopathy or radiculopathy, cervical region: Secondary | ICD-10-CM | POA: Diagnosis not present

## 2023-07-20 DIAGNOSIS — M4802 Spinal stenosis, cervical region: Secondary | ICD-10-CM | POA: Diagnosis not present

## 2023-07-20 DIAGNOSIS — E119 Type 2 diabetes mellitus without complications: Secondary | ICD-10-CM | POA: Diagnosis not present

## 2023-07-20 DIAGNOSIS — Z556 Problems related to health literacy: Secondary | ICD-10-CM | POA: Diagnosis not present

## 2023-07-23 DIAGNOSIS — Z8673 Personal history of transient ischemic attack (TIA), and cerebral infarction without residual deficits: Secondary | ICD-10-CM | POA: Diagnosis not present

## 2023-07-23 DIAGNOSIS — Z993 Dependence on wheelchair: Secondary | ICD-10-CM | POA: Diagnosis not present

## 2023-07-23 DIAGNOSIS — I10 Essential (primary) hypertension: Secondary | ICD-10-CM | POA: Diagnosis not present

## 2023-07-23 DIAGNOSIS — Z7984 Long term (current) use of oral hypoglycemic drugs: Secondary | ICD-10-CM | POA: Diagnosis not present

## 2023-07-23 DIAGNOSIS — Z556 Problems related to health literacy: Secondary | ICD-10-CM | POA: Diagnosis not present

## 2023-07-23 DIAGNOSIS — Z9181 History of falling: Secondary | ICD-10-CM | POA: Diagnosis not present

## 2023-07-23 DIAGNOSIS — M47812 Spondylosis without myelopathy or radiculopathy, cervical region: Secondary | ICD-10-CM | POA: Diagnosis not present

## 2023-07-23 DIAGNOSIS — E119 Type 2 diabetes mellitus without complications: Secondary | ICD-10-CM | POA: Diagnosis not present

## 2023-07-23 DIAGNOSIS — Z87440 Personal history of urinary (tract) infections: Secondary | ICD-10-CM | POA: Diagnosis not present

## 2023-07-23 DIAGNOSIS — R32 Unspecified urinary incontinence: Secondary | ICD-10-CM | POA: Diagnosis not present

## 2023-07-23 DIAGNOSIS — M4802 Spinal stenosis, cervical region: Secondary | ICD-10-CM | POA: Diagnosis not present

## 2023-07-25 ENCOUNTER — Encounter: Payer: Self-pay | Admitting: Podiatry

## 2023-07-25 ENCOUNTER — Ambulatory Visit: Admitting: Podiatry

## 2023-07-25 DIAGNOSIS — E1151 Type 2 diabetes mellitus with diabetic peripheral angiopathy without gangrene: Secondary | ICD-10-CM | POA: Diagnosis not present

## 2023-07-25 DIAGNOSIS — M79674 Pain in right toe(s): Secondary | ICD-10-CM | POA: Diagnosis not present

## 2023-07-25 DIAGNOSIS — M79675 Pain in left toe(s): Secondary | ICD-10-CM

## 2023-07-25 DIAGNOSIS — B351 Tinea unguium: Secondary | ICD-10-CM | POA: Diagnosis not present

## 2023-07-25 DIAGNOSIS — I890 Lymphedema, not elsewhere classified: Secondary | ICD-10-CM

## 2023-07-25 NOTE — Progress Notes (Signed)
This patient returns to my office for at risk foot care.  This patient requires this care by a professional since this patient will be at risk due to having diabetes and kidney injury.  This patient is unable to cut nails herself since the patient cannot reach her nails.These nails are painful walking and wearing shoes.  This patient presents for at risk foot care today.  General Appearance  Alert, conversant and in no acute stress.  Vascular  Dorsalis pedis and posterior tibial  pulses are not  palpable  Bilaterally  Due to swelling..  Capillary return is within normal limits  bilaterally. Temperature is within normal limits  bilaterally.  Neurologic  Senn-Weinstein monofilament wire test within normal limits  bilaterally. Muscle power within normal limits bilaterally.  Nails Thick disfigured discolored nails with subungual debris  Hallux nails  B/L.Marland Kitchen No evidence of bacterial infection or drainage bilaterally.  Orthopedic  No limitations of motion  feet .  No crepitus or effusions noted.  No bony pathology or digital deformities noted.  Pes planus.  Skin  normotropic skin with no porokeratosis noted bilaterally.  No signs of infections or ulcers noted.     Onychomycosis  Pain in right toes  Pain in left toes  Consent was obtained for treatment procedures.   Mechanical debridement of hallux nails   bilaterally performed with a nail nipper.     Return office visit  3 months                    Told patient to return for periodic foot care and evaluation due to potential at risk complications.   Helane Gunther DPM

## 2023-07-26 DIAGNOSIS — Z87440 Personal history of urinary (tract) infections: Secondary | ICD-10-CM | POA: Diagnosis not present

## 2023-07-26 DIAGNOSIS — Z993 Dependence on wheelchair: Secondary | ICD-10-CM | POA: Diagnosis not present

## 2023-07-26 DIAGNOSIS — M47812 Spondylosis without myelopathy or radiculopathy, cervical region: Secondary | ICD-10-CM | POA: Diagnosis not present

## 2023-07-26 DIAGNOSIS — Z7984 Long term (current) use of oral hypoglycemic drugs: Secondary | ICD-10-CM | POA: Diagnosis not present

## 2023-07-26 DIAGNOSIS — E119 Type 2 diabetes mellitus without complications: Secondary | ICD-10-CM | POA: Diagnosis not present

## 2023-07-26 DIAGNOSIS — Z556 Problems related to health literacy: Secondary | ICD-10-CM | POA: Diagnosis not present

## 2023-07-26 DIAGNOSIS — I10 Essential (primary) hypertension: Secondary | ICD-10-CM | POA: Diagnosis not present

## 2023-07-26 DIAGNOSIS — R32 Unspecified urinary incontinence: Secondary | ICD-10-CM | POA: Diagnosis not present

## 2023-07-26 DIAGNOSIS — Z8673 Personal history of transient ischemic attack (TIA), and cerebral infarction without residual deficits: Secondary | ICD-10-CM | POA: Diagnosis not present

## 2023-07-26 DIAGNOSIS — M4802 Spinal stenosis, cervical region: Secondary | ICD-10-CM | POA: Diagnosis not present

## 2023-07-26 DIAGNOSIS — Z9181 History of falling: Secondary | ICD-10-CM | POA: Diagnosis not present

## 2023-07-27 DIAGNOSIS — Z9181 History of falling: Secondary | ICD-10-CM | POA: Diagnosis not present

## 2023-07-27 DIAGNOSIS — Z556 Problems related to health literacy: Secondary | ICD-10-CM | POA: Diagnosis not present

## 2023-07-27 DIAGNOSIS — M4802 Spinal stenosis, cervical region: Secondary | ICD-10-CM | POA: Diagnosis not present

## 2023-07-27 DIAGNOSIS — Z7984 Long term (current) use of oral hypoglycemic drugs: Secondary | ICD-10-CM | POA: Diagnosis not present

## 2023-07-27 DIAGNOSIS — Z87440 Personal history of urinary (tract) infections: Secondary | ICD-10-CM | POA: Diagnosis not present

## 2023-07-27 DIAGNOSIS — M47812 Spondylosis without myelopathy or radiculopathy, cervical region: Secondary | ICD-10-CM | POA: Diagnosis not present

## 2023-07-27 DIAGNOSIS — R32 Unspecified urinary incontinence: Secondary | ICD-10-CM | POA: Diagnosis not present

## 2023-07-27 DIAGNOSIS — Z993 Dependence on wheelchair: Secondary | ICD-10-CM | POA: Diagnosis not present

## 2023-07-27 DIAGNOSIS — Z8673 Personal history of transient ischemic attack (TIA), and cerebral infarction without residual deficits: Secondary | ICD-10-CM | POA: Diagnosis not present

## 2023-07-27 DIAGNOSIS — I10 Essential (primary) hypertension: Secondary | ICD-10-CM | POA: Diagnosis not present

## 2023-07-27 DIAGNOSIS — E119 Type 2 diabetes mellitus without complications: Secondary | ICD-10-CM | POA: Diagnosis not present

## 2023-07-30 DIAGNOSIS — M47812 Spondylosis without myelopathy or radiculopathy, cervical region: Secondary | ICD-10-CM | POA: Diagnosis not present

## 2023-07-30 DIAGNOSIS — E119 Type 2 diabetes mellitus without complications: Secondary | ICD-10-CM | POA: Diagnosis not present

## 2023-07-30 DIAGNOSIS — I10 Essential (primary) hypertension: Secondary | ICD-10-CM | POA: Diagnosis not present

## 2023-07-30 DIAGNOSIS — R32 Unspecified urinary incontinence: Secondary | ICD-10-CM | POA: Diagnosis not present

## 2023-07-30 DIAGNOSIS — Z8673 Personal history of transient ischemic attack (TIA), and cerebral infarction without residual deficits: Secondary | ICD-10-CM | POA: Diagnosis not present

## 2023-07-30 DIAGNOSIS — M4802 Spinal stenosis, cervical region: Secondary | ICD-10-CM | POA: Diagnosis not present

## 2023-07-30 DIAGNOSIS — Z556 Problems related to health literacy: Secondary | ICD-10-CM | POA: Diagnosis not present

## 2023-07-30 DIAGNOSIS — Z7984 Long term (current) use of oral hypoglycemic drugs: Secondary | ICD-10-CM | POA: Diagnosis not present

## 2023-07-30 DIAGNOSIS — Z87440 Personal history of urinary (tract) infections: Secondary | ICD-10-CM | POA: Diagnosis not present

## 2023-07-30 DIAGNOSIS — Z993 Dependence on wheelchair: Secondary | ICD-10-CM | POA: Diagnosis not present

## 2023-07-30 DIAGNOSIS — Z9181 History of falling: Secondary | ICD-10-CM | POA: Diagnosis not present

## 2023-07-31 DIAGNOSIS — I10 Essential (primary) hypertension: Secondary | ICD-10-CM | POA: Diagnosis not present

## 2023-07-31 DIAGNOSIS — M4802 Spinal stenosis, cervical region: Secondary | ICD-10-CM | POA: Diagnosis not present

## 2023-07-31 DIAGNOSIS — Z9181 History of falling: Secondary | ICD-10-CM | POA: Diagnosis not present

## 2023-07-31 DIAGNOSIS — Z8673 Personal history of transient ischemic attack (TIA), and cerebral infarction without residual deficits: Secondary | ICD-10-CM | POA: Diagnosis not present

## 2023-07-31 DIAGNOSIS — Z993 Dependence on wheelchair: Secondary | ICD-10-CM | POA: Diagnosis not present

## 2023-07-31 DIAGNOSIS — Z556 Problems related to health literacy: Secondary | ICD-10-CM | POA: Diagnosis not present

## 2023-07-31 DIAGNOSIS — E119 Type 2 diabetes mellitus without complications: Secondary | ICD-10-CM | POA: Diagnosis not present

## 2023-07-31 DIAGNOSIS — M47812 Spondylosis without myelopathy or radiculopathy, cervical region: Secondary | ICD-10-CM | POA: Diagnosis not present

## 2023-07-31 DIAGNOSIS — Z87440 Personal history of urinary (tract) infections: Secondary | ICD-10-CM | POA: Diagnosis not present

## 2023-07-31 DIAGNOSIS — Z7984 Long term (current) use of oral hypoglycemic drugs: Secondary | ICD-10-CM | POA: Diagnosis not present

## 2023-07-31 DIAGNOSIS — R32 Unspecified urinary incontinence: Secondary | ICD-10-CM | POA: Diagnosis not present

## 2023-08-02 DIAGNOSIS — Z556 Problems related to health literacy: Secondary | ICD-10-CM | POA: Diagnosis not present

## 2023-08-02 DIAGNOSIS — I10 Essential (primary) hypertension: Secondary | ICD-10-CM | POA: Diagnosis not present

## 2023-08-02 DIAGNOSIS — M4802 Spinal stenosis, cervical region: Secondary | ICD-10-CM | POA: Diagnosis not present

## 2023-08-02 DIAGNOSIS — R32 Unspecified urinary incontinence: Secondary | ICD-10-CM | POA: Diagnosis not present

## 2023-08-02 DIAGNOSIS — E119 Type 2 diabetes mellitus without complications: Secondary | ICD-10-CM | POA: Diagnosis not present

## 2023-08-02 DIAGNOSIS — Z8673 Personal history of transient ischemic attack (TIA), and cerebral infarction without residual deficits: Secondary | ICD-10-CM | POA: Diagnosis not present

## 2023-08-02 DIAGNOSIS — Z9181 History of falling: Secondary | ICD-10-CM | POA: Diagnosis not present

## 2023-08-02 DIAGNOSIS — Z87440 Personal history of urinary (tract) infections: Secondary | ICD-10-CM | POA: Diagnosis not present

## 2023-08-02 DIAGNOSIS — M47812 Spondylosis without myelopathy or radiculopathy, cervical region: Secondary | ICD-10-CM | POA: Diagnosis not present

## 2023-08-02 DIAGNOSIS — Z7984 Long term (current) use of oral hypoglycemic drugs: Secondary | ICD-10-CM | POA: Diagnosis not present

## 2023-08-02 DIAGNOSIS — Z993 Dependence on wheelchair: Secondary | ICD-10-CM | POA: Diagnosis not present

## 2023-08-03 DIAGNOSIS — Z556 Problems related to health literacy: Secondary | ICD-10-CM | POA: Diagnosis not present

## 2023-08-03 DIAGNOSIS — Z8673 Personal history of transient ischemic attack (TIA), and cerebral infarction without residual deficits: Secondary | ICD-10-CM | POA: Diagnosis not present

## 2023-08-03 DIAGNOSIS — Z87440 Personal history of urinary (tract) infections: Secondary | ICD-10-CM | POA: Diagnosis not present

## 2023-08-03 DIAGNOSIS — R32 Unspecified urinary incontinence: Secondary | ICD-10-CM | POA: Diagnosis not present

## 2023-08-03 DIAGNOSIS — Z993 Dependence on wheelchair: Secondary | ICD-10-CM | POA: Diagnosis not present

## 2023-08-03 DIAGNOSIS — M47812 Spondylosis without myelopathy or radiculopathy, cervical region: Secondary | ICD-10-CM | POA: Diagnosis not present

## 2023-08-03 DIAGNOSIS — M4802 Spinal stenosis, cervical region: Secondary | ICD-10-CM | POA: Diagnosis not present

## 2023-08-03 DIAGNOSIS — E119 Type 2 diabetes mellitus without complications: Secondary | ICD-10-CM | POA: Diagnosis not present

## 2023-08-03 DIAGNOSIS — I10 Essential (primary) hypertension: Secondary | ICD-10-CM | POA: Diagnosis not present

## 2023-08-03 DIAGNOSIS — Z9181 History of falling: Secondary | ICD-10-CM | POA: Diagnosis not present

## 2023-08-03 DIAGNOSIS — Z7984 Long term (current) use of oral hypoglycemic drugs: Secondary | ICD-10-CM | POA: Diagnosis not present

## 2023-08-07 DIAGNOSIS — Z993 Dependence on wheelchair: Secondary | ICD-10-CM | POA: Diagnosis not present

## 2023-08-07 DIAGNOSIS — Z9181 History of falling: Secondary | ICD-10-CM | POA: Diagnosis not present

## 2023-08-07 DIAGNOSIS — M47812 Spondylosis without myelopathy or radiculopathy, cervical region: Secondary | ICD-10-CM | POA: Diagnosis not present

## 2023-08-07 DIAGNOSIS — R32 Unspecified urinary incontinence: Secondary | ICD-10-CM | POA: Diagnosis not present

## 2023-08-07 DIAGNOSIS — Z8673 Personal history of transient ischemic attack (TIA), and cerebral infarction without residual deficits: Secondary | ICD-10-CM | POA: Diagnosis not present

## 2023-08-07 DIAGNOSIS — Z556 Problems related to health literacy: Secondary | ICD-10-CM | POA: Diagnosis not present

## 2023-08-07 DIAGNOSIS — M4802 Spinal stenosis, cervical region: Secondary | ICD-10-CM | POA: Diagnosis not present

## 2023-08-07 DIAGNOSIS — Z87440 Personal history of urinary (tract) infections: Secondary | ICD-10-CM | POA: Diagnosis not present

## 2023-08-07 DIAGNOSIS — I10 Essential (primary) hypertension: Secondary | ICD-10-CM | POA: Diagnosis not present

## 2023-08-07 DIAGNOSIS — E119 Type 2 diabetes mellitus without complications: Secondary | ICD-10-CM | POA: Diagnosis not present

## 2023-08-07 DIAGNOSIS — Z7984 Long term (current) use of oral hypoglycemic drugs: Secondary | ICD-10-CM | POA: Diagnosis not present

## 2023-08-08 DIAGNOSIS — Z7984 Long term (current) use of oral hypoglycemic drugs: Secondary | ICD-10-CM | POA: Diagnosis not present

## 2023-08-08 DIAGNOSIS — Z8673 Personal history of transient ischemic attack (TIA), and cerebral infarction without residual deficits: Secondary | ICD-10-CM | POA: Diagnosis not present

## 2023-08-08 DIAGNOSIS — E119 Type 2 diabetes mellitus without complications: Secondary | ICD-10-CM | POA: Diagnosis not present

## 2023-08-08 DIAGNOSIS — I10 Essential (primary) hypertension: Secondary | ICD-10-CM | POA: Diagnosis not present

## 2023-08-08 DIAGNOSIS — R32 Unspecified urinary incontinence: Secondary | ICD-10-CM | POA: Diagnosis not present

## 2023-08-08 DIAGNOSIS — Z993 Dependence on wheelchair: Secondary | ICD-10-CM | POA: Diagnosis not present

## 2023-08-08 DIAGNOSIS — Z87440 Personal history of urinary (tract) infections: Secondary | ICD-10-CM | POA: Diagnosis not present

## 2023-08-08 DIAGNOSIS — Z9181 History of falling: Secondary | ICD-10-CM | POA: Diagnosis not present

## 2023-08-08 DIAGNOSIS — M47812 Spondylosis without myelopathy or radiculopathy, cervical region: Secondary | ICD-10-CM | POA: Diagnosis not present

## 2023-08-08 DIAGNOSIS — Z556 Problems related to health literacy: Secondary | ICD-10-CM | POA: Diagnosis not present

## 2023-08-08 DIAGNOSIS — M4802 Spinal stenosis, cervical region: Secondary | ICD-10-CM | POA: Diagnosis not present

## 2023-08-09 DIAGNOSIS — R32 Unspecified urinary incontinence: Secondary | ICD-10-CM | POA: Diagnosis not present

## 2023-08-09 DIAGNOSIS — Z87440 Personal history of urinary (tract) infections: Secondary | ICD-10-CM | POA: Diagnosis not present

## 2023-08-09 DIAGNOSIS — Z8673 Personal history of transient ischemic attack (TIA), and cerebral infarction without residual deficits: Secondary | ICD-10-CM | POA: Diagnosis not present

## 2023-08-09 DIAGNOSIS — Z7984 Long term (current) use of oral hypoglycemic drugs: Secondary | ICD-10-CM | POA: Diagnosis not present

## 2023-08-09 DIAGNOSIS — M47812 Spondylosis without myelopathy or radiculopathy, cervical region: Secondary | ICD-10-CM | POA: Diagnosis not present

## 2023-08-09 DIAGNOSIS — M4802 Spinal stenosis, cervical region: Secondary | ICD-10-CM | POA: Diagnosis not present

## 2023-08-09 DIAGNOSIS — Z9181 History of falling: Secondary | ICD-10-CM | POA: Diagnosis not present

## 2023-08-09 DIAGNOSIS — E119 Type 2 diabetes mellitus without complications: Secondary | ICD-10-CM | POA: Diagnosis not present

## 2023-08-09 DIAGNOSIS — I10 Essential (primary) hypertension: Secondary | ICD-10-CM | POA: Diagnosis not present

## 2023-08-09 DIAGNOSIS — Z556 Problems related to health literacy: Secondary | ICD-10-CM | POA: Diagnosis not present

## 2023-08-09 DIAGNOSIS — Z993 Dependence on wheelchair: Secondary | ICD-10-CM | POA: Diagnosis not present

## 2023-08-13 DIAGNOSIS — Z7984 Long term (current) use of oral hypoglycemic drugs: Secondary | ICD-10-CM | POA: Diagnosis not present

## 2023-08-13 DIAGNOSIS — Z9181 History of falling: Secondary | ICD-10-CM | POA: Diagnosis not present

## 2023-08-13 DIAGNOSIS — E119 Type 2 diabetes mellitus without complications: Secondary | ICD-10-CM | POA: Diagnosis not present

## 2023-08-13 DIAGNOSIS — M4802 Spinal stenosis, cervical region: Secondary | ICD-10-CM | POA: Diagnosis not present

## 2023-08-13 DIAGNOSIS — R32 Unspecified urinary incontinence: Secondary | ICD-10-CM | POA: Diagnosis not present

## 2023-08-13 DIAGNOSIS — Z556 Problems related to health literacy: Secondary | ICD-10-CM | POA: Diagnosis not present

## 2023-08-13 DIAGNOSIS — Z993 Dependence on wheelchair: Secondary | ICD-10-CM | POA: Diagnosis not present

## 2023-08-13 DIAGNOSIS — M47812 Spondylosis without myelopathy or radiculopathy, cervical region: Secondary | ICD-10-CM | POA: Diagnosis not present

## 2023-08-13 DIAGNOSIS — Z8673 Personal history of transient ischemic attack (TIA), and cerebral infarction without residual deficits: Secondary | ICD-10-CM | POA: Diagnosis not present

## 2023-08-13 DIAGNOSIS — I10 Essential (primary) hypertension: Secondary | ICD-10-CM | POA: Diagnosis not present

## 2023-08-13 DIAGNOSIS — Z87440 Personal history of urinary (tract) infections: Secondary | ICD-10-CM | POA: Diagnosis not present

## 2023-08-16 DIAGNOSIS — Z87440 Personal history of urinary (tract) infections: Secondary | ICD-10-CM | POA: Diagnosis not present

## 2023-08-16 DIAGNOSIS — Z7984 Long term (current) use of oral hypoglycemic drugs: Secondary | ICD-10-CM | POA: Diagnosis not present

## 2023-08-16 DIAGNOSIS — I10 Essential (primary) hypertension: Secondary | ICD-10-CM | POA: Diagnosis not present

## 2023-08-16 DIAGNOSIS — Z9181 History of falling: Secondary | ICD-10-CM | POA: Diagnosis not present

## 2023-08-16 DIAGNOSIS — R32 Unspecified urinary incontinence: Secondary | ICD-10-CM | POA: Diagnosis not present

## 2023-08-16 DIAGNOSIS — Z8673 Personal history of transient ischemic attack (TIA), and cerebral infarction without residual deficits: Secondary | ICD-10-CM | POA: Diagnosis not present

## 2023-08-16 DIAGNOSIS — Z556 Problems related to health literacy: Secondary | ICD-10-CM | POA: Diagnosis not present

## 2023-08-16 DIAGNOSIS — M4802 Spinal stenosis, cervical region: Secondary | ICD-10-CM | POA: Diagnosis not present

## 2023-08-16 DIAGNOSIS — Z993 Dependence on wheelchair: Secondary | ICD-10-CM | POA: Diagnosis not present

## 2023-08-16 DIAGNOSIS — M47812 Spondylosis without myelopathy or radiculopathy, cervical region: Secondary | ICD-10-CM | POA: Diagnosis not present

## 2023-08-16 DIAGNOSIS — E119 Type 2 diabetes mellitus without complications: Secondary | ICD-10-CM | POA: Diagnosis not present

## 2023-08-17 DIAGNOSIS — Z993 Dependence on wheelchair: Secondary | ICD-10-CM | POA: Diagnosis not present

## 2023-08-17 DIAGNOSIS — I10 Essential (primary) hypertension: Secondary | ICD-10-CM | POA: Diagnosis not present

## 2023-08-17 DIAGNOSIS — Z556 Problems related to health literacy: Secondary | ICD-10-CM | POA: Diagnosis not present

## 2023-08-17 DIAGNOSIS — E119 Type 2 diabetes mellitus without complications: Secondary | ICD-10-CM | POA: Diagnosis not present

## 2023-08-17 DIAGNOSIS — Z9181 History of falling: Secondary | ICD-10-CM | POA: Diagnosis not present

## 2023-08-17 DIAGNOSIS — M47812 Spondylosis without myelopathy or radiculopathy, cervical region: Secondary | ICD-10-CM | POA: Diagnosis not present

## 2023-08-17 DIAGNOSIS — Z87440 Personal history of urinary (tract) infections: Secondary | ICD-10-CM | POA: Diagnosis not present

## 2023-08-17 DIAGNOSIS — Z8673 Personal history of transient ischemic attack (TIA), and cerebral infarction without residual deficits: Secondary | ICD-10-CM | POA: Diagnosis not present

## 2023-08-17 DIAGNOSIS — M4802 Spinal stenosis, cervical region: Secondary | ICD-10-CM | POA: Diagnosis not present

## 2023-08-17 DIAGNOSIS — R32 Unspecified urinary incontinence: Secondary | ICD-10-CM | POA: Diagnosis not present

## 2023-08-17 DIAGNOSIS — Z7984 Long term (current) use of oral hypoglycemic drugs: Secondary | ICD-10-CM | POA: Diagnosis not present

## 2023-08-20 ENCOUNTER — Encounter: Payer: Self-pay | Admitting: Neurology

## 2023-08-20 ENCOUNTER — Ambulatory Visit: Payer: Medicare HMO | Admitting: Neurology

## 2023-08-20 VITALS — BP 177/77 | HR 75 | Resp 15 | Ht 62.0 in

## 2023-08-20 DIAGNOSIS — M47812 Spondylosis without myelopathy or radiculopathy, cervical region: Secondary | ICD-10-CM

## 2023-08-20 DIAGNOSIS — I679 Cerebrovascular disease, unspecified: Secondary | ICD-10-CM | POA: Diagnosis not present

## 2023-08-20 DIAGNOSIS — R269 Unspecified abnormalities of gait and mobility: Secondary | ICD-10-CM

## 2023-08-20 NOTE — Progress Notes (Signed)
 Chief Complaint  Patient presents with   Tremors    Rm14, daughter present, Parkinson's disease 3 month follow IO:NGEXB much better and no longer uses walker      ASSESSMENT AND PLAN  Chloe Bradley is a 89 y.o. female   Gradual onset gait abnormality urinary urgency, Cerebrovascular disease  MRI of the brain showed moderate to advanced small vessel disease  Complete evaluation with echocardiogram, ultrasound of carotid artery  Muscular risk factor of aging, hypertension diabetes ' Suggest aspirin 81 mg daily  Gait is much changed with physical therapy, MRI of cervical spine showed multilevel degenerative changes, mild canal stenosis at C5-6, variable degree of foraminal narrowing, not a surgical candidate at this stage, encouraged her moderate exercise, And  DIAGNOSTIC DATA (LABS, IMAGING, TESTING) - I reviewed patient records, labs, notes, testing and imaging myself where available.   MEDICAL HISTORY:  Chloe Bradley, is a 89 year old female, accompanied by her daughter seen in request by her primary care from [facility] Dr. Genita Keys, [place]o Gasmen, for evaluation of abnormal CAT scan, initial evaluation was on May 07, 2023  History is obtained from the patient and review of electronic medical records. I personally reviewed pertinent available imaging films in PACS.   PMHx of  HTN DM Cataract surgery,   She lives at home with her son, quit driving few years ago, enjoying reading, writing poems, but becoming less active, more sedentary, developed gradual onset mild unsteady gait over the past 6 months, began to use walker  She had a few months history of left shoulder and arm pain, on the day of April 13, 2023, daughter took her to urgent care, she only carry her cane with her, on her way out, her knee gave out underneath her, fell face down on the concrete floor, no loss of consciousness, she was able to get up with help  CT head without contrast, no acute abnormality,  extensive periventricular small vessel disease left caudate lacunar infarction  CT cervical spine multilevel degenerative changes, extensive osteophyte, at least moderate stenosis at C6-7,  She also has slow worsening urinary urgency, now on left wrist splint  Update August 20, 2023: She is accompanied by her daughter at today's clinical visit, receiving home PT OT, which has made a big difference, she can walk better, continue to have decreased appetite,  Reviewed MRI of cervical spine March 2025, multilevel degenerative changes, most noticeable at C5-6, disc protrusion, with mild canal stenosis, moderate left foraminal narrowing MRI of the brain showed mild generalized atrophy, moderate to advanced small vessel disease  PHYSICAL EXAM:   Vitals:   08/20/23 1441 08/20/23 1446  BP: (!) 175/74 (!) 177/77  Pulse:  75  Resp:  15  Height:  5\' 2"  (1.575 m)     Body mass index is 25.61 kg/m.  PHYSICAL EXAMNIATION:  Gen: NAD, conversant, well nourised, well groomed                     Cardiovascular: Regular rate rhythm, no peripheral edema, warm, nontender. Eyes: Conjunctivae clear without exudates or hemorrhage Neck: Supple, no carotid bruits. Pulmonary: Clear to auscultation bilaterally   NEUROLOGICAL EXAM:  MENTAL STATUS: Speech/cognition: Awake, alert, oriented to history taking and casual conversation  CRANIAL NERVES: CN II: Visual fields are full to confrontation. Pupils are round equal and briskly reactive to light. CN III, IV, VI: extraocular movement are normal. No ptosis. CN V: Facial sensation is intact to light touch CN VII: Face  is symmetric with normal eye closure  CN VIII: Hearing is normal to causal conversation. CN IX, X: Phonation is normal. CN XI: Head turning and shoulder shrug are intact  MOTOR: Left wrist in splint, limited range of motion of left shoulder, mild fixation of left arm on rapid rotating movement,  REFLEXES: Reflexes are 2+ and symmetric  at the biceps, triceps, knees, and absent ankles. Plantar responses are extensor bilaterally  SENSORY: Length-dependent decreased to light touch, pinprick and vibratory sensation to midshin level,  bilateral lower extremity pitting edema  COORDINATION: There is no trunk or limb dysmetria noted.  GAIT/STANCE: Push-up to get up from seated position, stiff, cautious.  REVIEW OF SYSTEMS:  Full 14 system review of systems performed and notable only for as above All other review of systems were negative.   ALLERGIES: Allergies  Allergen Reactions   Ace Inhibitors Anaphylaxis   Ace Inhibitors Nausea And Vomiting and Other (See Comments)    PAH/no circulation, kidneys stopped working and moved to ICU for 8 days   Shellfish Allergy Hives    HOME MEDICATIONS: Current Outpatient Medications  Medication Sig Dispense Refill   EDARBI 80 MG TABS      felodipine (PLENDIL) 10 MG 24 hr tablet Take 10 mg by mouth every evening.      TRADJENTA 5 MG TABS tablet      No current facility-administered medications for this visit.    PAST MEDICAL HISTORY: Past Medical History:  Diagnosis Date   Diabetes mellitus without complication (HCC)    Hypertension     PAST SURGICAL HISTORY: Past Surgical History:  Procedure Laterality Date   ABDOMINAL HYSTERECTOMY     EYE SURGERY     bilateral cataract extraction    FAMILY HISTORY: History reviewed. No pertinent family history.  SOCIAL HISTORY: Social History   Socioeconomic History   Marital status: Single    Spouse name: Not on file   Number of children: Not on file   Years of education: Not on file   Highest education level: Not on file  Occupational History   Not on file  Tobacco Use   Smoking status: Never   Smokeless tobacco: Never  Vaping Use   Vaping status: Never Used  Substance and Sexual Activity   Alcohol  use: Never   Drug use: Never   Sexual activity: Not on file  Other Topics Concern   Not on file  Social History  Narrative   ** Merged History Encounter **       ** Merged History Encounter **       Social Drivers of Corporate investment banker Strain: Not on file  Food Insecurity: Not on file  Transportation Needs: Not on file  Physical Activity: Not on file  Stress: Not on file  Social Connections: Not on file  Intimate Partner Violence: Not on file      Phebe Brasil, M.D. Ph.D.  [facility] [address] [place], [place] [address] Ph: [phone] Fax: [phone]  CC:  Wilburn Handler, MD [address] [place],  [place] [address]  Wilburn Handler, MD

## 2023-08-21 DIAGNOSIS — E1169 Type 2 diabetes mellitus with other specified complication: Secondary | ICD-10-CM | POA: Diagnosis not present

## 2023-08-21 DIAGNOSIS — R269 Unspecified abnormalities of gait and mobility: Secondary | ICD-10-CM | POA: Diagnosis not present

## 2023-08-21 DIAGNOSIS — R531 Weakness: Secondary | ICD-10-CM | POA: Diagnosis not present

## 2023-08-21 DIAGNOSIS — I10 Essential (primary) hypertension: Secondary | ICD-10-CM | POA: Diagnosis not present

## 2023-08-22 DIAGNOSIS — M4802 Spinal stenosis, cervical region: Secondary | ICD-10-CM | POA: Diagnosis not present

## 2023-08-22 DIAGNOSIS — M47812 Spondylosis without myelopathy or radiculopathy, cervical region: Secondary | ICD-10-CM | POA: Diagnosis not present

## 2023-08-22 DIAGNOSIS — I10 Essential (primary) hypertension: Secondary | ICD-10-CM | POA: Diagnosis not present

## 2023-08-22 DIAGNOSIS — E119 Type 2 diabetes mellitus without complications: Secondary | ICD-10-CM | POA: Diagnosis not present

## 2023-08-22 DIAGNOSIS — Z9181 History of falling: Secondary | ICD-10-CM | POA: Diagnosis not present

## 2023-08-22 DIAGNOSIS — Z556 Problems related to health literacy: Secondary | ICD-10-CM | POA: Diagnosis not present

## 2023-08-22 DIAGNOSIS — R32 Unspecified urinary incontinence: Secondary | ICD-10-CM | POA: Diagnosis not present

## 2023-08-22 DIAGNOSIS — Z7984 Long term (current) use of oral hypoglycemic drugs: Secondary | ICD-10-CM | POA: Diagnosis not present

## 2023-08-22 DIAGNOSIS — Z87440 Personal history of urinary (tract) infections: Secondary | ICD-10-CM | POA: Diagnosis not present

## 2023-08-22 DIAGNOSIS — Z8673 Personal history of transient ischemic attack (TIA), and cerebral infarction without residual deficits: Secondary | ICD-10-CM | POA: Diagnosis not present

## 2023-08-22 DIAGNOSIS — Z993 Dependence on wheelchair: Secondary | ICD-10-CM | POA: Diagnosis not present

## 2023-08-27 DIAGNOSIS — E119 Type 2 diabetes mellitus without complications: Secondary | ICD-10-CM | POA: Diagnosis not present

## 2023-08-27 DIAGNOSIS — Z8673 Personal history of transient ischemic attack (TIA), and cerebral infarction without residual deficits: Secondary | ICD-10-CM | POA: Diagnosis not present

## 2023-08-27 DIAGNOSIS — I10 Essential (primary) hypertension: Secondary | ICD-10-CM | POA: Diagnosis not present

## 2023-08-27 DIAGNOSIS — Z993 Dependence on wheelchair: Secondary | ICD-10-CM | POA: Diagnosis not present

## 2023-08-27 DIAGNOSIS — R32 Unspecified urinary incontinence: Secondary | ICD-10-CM | POA: Diagnosis not present

## 2023-08-27 DIAGNOSIS — Z7984 Long term (current) use of oral hypoglycemic drugs: Secondary | ICD-10-CM | POA: Diagnosis not present

## 2023-08-27 DIAGNOSIS — M4802 Spinal stenosis, cervical region: Secondary | ICD-10-CM | POA: Diagnosis not present

## 2023-08-27 DIAGNOSIS — Z556 Problems related to health literacy: Secondary | ICD-10-CM | POA: Diagnosis not present

## 2023-08-27 DIAGNOSIS — Z9181 History of falling: Secondary | ICD-10-CM | POA: Diagnosis not present

## 2023-08-27 DIAGNOSIS — M47812 Spondylosis without myelopathy or radiculopathy, cervical region: Secondary | ICD-10-CM | POA: Diagnosis not present

## 2023-08-27 DIAGNOSIS — Z87440 Personal history of urinary (tract) infections: Secondary | ICD-10-CM | POA: Diagnosis not present

## 2023-08-30 DIAGNOSIS — Z993 Dependence on wheelchair: Secondary | ICD-10-CM | POA: Diagnosis not present

## 2023-08-30 DIAGNOSIS — Z87440 Personal history of urinary (tract) infections: Secondary | ICD-10-CM | POA: Diagnosis not present

## 2023-08-30 DIAGNOSIS — Z9181 History of falling: Secondary | ICD-10-CM | POA: Diagnosis not present

## 2023-08-30 DIAGNOSIS — M47812 Spondylosis without myelopathy or radiculopathy, cervical region: Secondary | ICD-10-CM | POA: Diagnosis not present

## 2023-08-30 DIAGNOSIS — Z8673 Personal history of transient ischemic attack (TIA), and cerebral infarction without residual deficits: Secondary | ICD-10-CM | POA: Diagnosis not present

## 2023-08-30 DIAGNOSIS — M4802 Spinal stenosis, cervical region: Secondary | ICD-10-CM | POA: Diagnosis not present

## 2023-08-30 DIAGNOSIS — Z556 Problems related to health literacy: Secondary | ICD-10-CM | POA: Diagnosis not present

## 2023-08-30 DIAGNOSIS — R32 Unspecified urinary incontinence: Secondary | ICD-10-CM | POA: Diagnosis not present

## 2023-08-30 DIAGNOSIS — Z7984 Long term (current) use of oral hypoglycemic drugs: Secondary | ICD-10-CM | POA: Diagnosis not present

## 2023-08-30 DIAGNOSIS — E119 Type 2 diabetes mellitus without complications: Secondary | ICD-10-CM | POA: Diagnosis not present

## 2023-08-30 DIAGNOSIS — I10 Essential (primary) hypertension: Secondary | ICD-10-CM | POA: Diagnosis not present

## 2023-08-31 DIAGNOSIS — M4802 Spinal stenosis, cervical region: Secondary | ICD-10-CM | POA: Diagnosis not present

## 2023-08-31 DIAGNOSIS — R32 Unspecified urinary incontinence: Secondary | ICD-10-CM | POA: Diagnosis not present

## 2023-08-31 DIAGNOSIS — Z7984 Long term (current) use of oral hypoglycemic drugs: Secondary | ICD-10-CM | POA: Diagnosis not present

## 2023-08-31 DIAGNOSIS — Z9181 History of falling: Secondary | ICD-10-CM | POA: Diagnosis not present

## 2023-08-31 DIAGNOSIS — M47812 Spondylosis without myelopathy or radiculopathy, cervical region: Secondary | ICD-10-CM | POA: Diagnosis not present

## 2023-08-31 DIAGNOSIS — I10 Essential (primary) hypertension: Secondary | ICD-10-CM | POA: Diagnosis not present

## 2023-08-31 DIAGNOSIS — Z8673 Personal history of transient ischemic attack (TIA), and cerebral infarction without residual deficits: Secondary | ICD-10-CM | POA: Diagnosis not present

## 2023-08-31 DIAGNOSIS — Z993 Dependence on wheelchair: Secondary | ICD-10-CM | POA: Diagnosis not present

## 2023-08-31 DIAGNOSIS — E119 Type 2 diabetes mellitus without complications: Secondary | ICD-10-CM | POA: Diagnosis not present

## 2023-08-31 DIAGNOSIS — Z87440 Personal history of urinary (tract) infections: Secondary | ICD-10-CM | POA: Diagnosis not present

## 2023-08-31 DIAGNOSIS — Z556 Problems related to health literacy: Secondary | ICD-10-CM | POA: Diagnosis not present

## 2023-09-05 DIAGNOSIS — I10 Essential (primary) hypertension: Secondary | ICD-10-CM | POA: Diagnosis not present

## 2023-09-05 DIAGNOSIS — Z9181 History of falling: Secondary | ICD-10-CM | POA: Diagnosis not present

## 2023-09-05 DIAGNOSIS — E119 Type 2 diabetes mellitus without complications: Secondary | ICD-10-CM | POA: Diagnosis not present

## 2023-09-05 DIAGNOSIS — R32 Unspecified urinary incontinence: Secondary | ICD-10-CM | POA: Diagnosis not present

## 2023-09-05 DIAGNOSIS — Z7984 Long term (current) use of oral hypoglycemic drugs: Secondary | ICD-10-CM | POA: Diagnosis not present

## 2023-09-05 DIAGNOSIS — Z993 Dependence on wheelchair: Secondary | ICD-10-CM | POA: Diagnosis not present

## 2023-09-05 DIAGNOSIS — M4802 Spinal stenosis, cervical region: Secondary | ICD-10-CM | POA: Diagnosis not present

## 2023-09-05 DIAGNOSIS — M47812 Spondylosis without myelopathy or radiculopathy, cervical region: Secondary | ICD-10-CM | POA: Diagnosis not present

## 2023-09-05 DIAGNOSIS — Z556 Problems related to health literacy: Secondary | ICD-10-CM | POA: Diagnosis not present

## 2023-09-05 DIAGNOSIS — Z8673 Personal history of transient ischemic attack (TIA), and cerebral infarction without residual deficits: Secondary | ICD-10-CM | POA: Diagnosis not present

## 2023-09-05 DIAGNOSIS — Z87440 Personal history of urinary (tract) infections: Secondary | ICD-10-CM | POA: Diagnosis not present

## 2023-09-14 DIAGNOSIS — Z7984 Long term (current) use of oral hypoglycemic drugs: Secondary | ICD-10-CM | POA: Diagnosis not present

## 2023-09-14 DIAGNOSIS — M47812 Spondylosis without myelopathy or radiculopathy, cervical region: Secondary | ICD-10-CM | POA: Diagnosis not present

## 2023-09-14 DIAGNOSIS — Z556 Problems related to health literacy: Secondary | ICD-10-CM | POA: Diagnosis not present

## 2023-09-14 DIAGNOSIS — Z8673 Personal history of transient ischemic attack (TIA), and cerebral infarction without residual deficits: Secondary | ICD-10-CM | POA: Diagnosis not present

## 2023-09-14 DIAGNOSIS — E119 Type 2 diabetes mellitus without complications: Secondary | ICD-10-CM | POA: Diagnosis not present

## 2023-09-14 DIAGNOSIS — Z9181 History of falling: Secondary | ICD-10-CM | POA: Diagnosis not present

## 2023-09-14 DIAGNOSIS — I10 Essential (primary) hypertension: Secondary | ICD-10-CM | POA: Diagnosis not present

## 2023-09-14 DIAGNOSIS — R32 Unspecified urinary incontinence: Secondary | ICD-10-CM | POA: Diagnosis not present

## 2023-09-14 DIAGNOSIS — Z993 Dependence on wheelchair: Secondary | ICD-10-CM | POA: Diagnosis not present

## 2023-09-14 DIAGNOSIS — M4802 Spinal stenosis, cervical region: Secondary | ICD-10-CM | POA: Diagnosis not present

## 2023-09-14 DIAGNOSIS — Z87440 Personal history of urinary (tract) infections: Secondary | ICD-10-CM | POA: Diagnosis not present

## 2023-09-18 DIAGNOSIS — Z9181 History of falling: Secondary | ICD-10-CM | POA: Diagnosis not present

## 2023-09-18 DIAGNOSIS — Z7984 Long term (current) use of oral hypoglycemic drugs: Secondary | ICD-10-CM | POA: Diagnosis not present

## 2023-09-18 DIAGNOSIS — I10 Essential (primary) hypertension: Secondary | ICD-10-CM | POA: Diagnosis not present

## 2023-09-18 DIAGNOSIS — Z87440 Personal history of urinary (tract) infections: Secondary | ICD-10-CM | POA: Diagnosis not present

## 2023-09-18 DIAGNOSIS — M4802 Spinal stenosis, cervical region: Secondary | ICD-10-CM | POA: Diagnosis not present

## 2023-09-18 DIAGNOSIS — Z8673 Personal history of transient ischemic attack (TIA), and cerebral infarction without residual deficits: Secondary | ICD-10-CM | POA: Diagnosis not present

## 2023-09-18 DIAGNOSIS — Z993 Dependence on wheelchair: Secondary | ICD-10-CM | POA: Diagnosis not present

## 2023-09-18 DIAGNOSIS — Z556 Problems related to health literacy: Secondary | ICD-10-CM | POA: Diagnosis not present

## 2023-09-18 DIAGNOSIS — M47812 Spondylosis without myelopathy or radiculopathy, cervical region: Secondary | ICD-10-CM | POA: Diagnosis not present

## 2023-09-18 DIAGNOSIS — E119 Type 2 diabetes mellitus without complications: Secondary | ICD-10-CM | POA: Diagnosis not present

## 2023-09-18 DIAGNOSIS — R32 Unspecified urinary incontinence: Secondary | ICD-10-CM | POA: Diagnosis not present

## 2023-09-24 DIAGNOSIS — E119 Type 2 diabetes mellitus without complications: Secondary | ICD-10-CM | POA: Diagnosis not present

## 2023-09-24 DIAGNOSIS — Z556 Problems related to health literacy: Secondary | ICD-10-CM | POA: Diagnosis not present

## 2023-09-24 DIAGNOSIS — Z8673 Personal history of transient ischemic attack (TIA), and cerebral infarction without residual deficits: Secondary | ICD-10-CM | POA: Diagnosis not present

## 2023-09-24 DIAGNOSIS — R32 Unspecified urinary incontinence: Secondary | ICD-10-CM | POA: Diagnosis not present

## 2023-09-24 DIAGNOSIS — Z993 Dependence on wheelchair: Secondary | ICD-10-CM | POA: Diagnosis not present

## 2023-09-24 DIAGNOSIS — M4802 Spinal stenosis, cervical region: Secondary | ICD-10-CM | POA: Diagnosis not present

## 2023-09-24 DIAGNOSIS — M47812 Spondylosis without myelopathy or radiculopathy, cervical region: Secondary | ICD-10-CM | POA: Diagnosis not present

## 2023-09-24 DIAGNOSIS — Z9181 History of falling: Secondary | ICD-10-CM | POA: Diagnosis not present

## 2023-09-24 DIAGNOSIS — Z87440 Personal history of urinary (tract) infections: Secondary | ICD-10-CM | POA: Diagnosis not present

## 2023-09-24 DIAGNOSIS — Z7984 Long term (current) use of oral hypoglycemic drugs: Secondary | ICD-10-CM | POA: Diagnosis not present

## 2023-09-24 DIAGNOSIS — I10 Essential (primary) hypertension: Secondary | ICD-10-CM | POA: Diagnosis not present

## 2023-10-02 ENCOUNTER — Telehealth: Payer: Self-pay

## 2023-10-02 NOTE — Telephone Encounter (Signed)
 Signed orders faxed to [facility]

## 2023-10-10 NOTE — Telephone Encounter (Signed)
 Home healthcare orders signed faxed to [facility]

## 2023-10-11 DIAGNOSIS — Z7984 Long term (current) use of oral hypoglycemic drugs: Secondary | ICD-10-CM | POA: Diagnosis not present

## 2023-10-11 DIAGNOSIS — M47812 Spondylosis without myelopathy or radiculopathy, cervical region: Secondary | ICD-10-CM | POA: Diagnosis not present

## 2023-10-11 DIAGNOSIS — I10 Essential (primary) hypertension: Secondary | ICD-10-CM | POA: Diagnosis not present

## 2023-10-11 DIAGNOSIS — Z9181 History of falling: Secondary | ICD-10-CM | POA: Diagnosis not present

## 2023-10-11 DIAGNOSIS — R32 Unspecified urinary incontinence: Secondary | ICD-10-CM | POA: Diagnosis not present

## 2023-10-11 DIAGNOSIS — M4802 Spinal stenosis, cervical region: Secondary | ICD-10-CM | POA: Diagnosis not present

## 2023-10-11 DIAGNOSIS — E119 Type 2 diabetes mellitus without complications: Secondary | ICD-10-CM | POA: Diagnosis not present

## 2023-10-11 DIAGNOSIS — Z993 Dependence on wheelchair: Secondary | ICD-10-CM | POA: Diagnosis not present

## 2023-10-11 DIAGNOSIS — Z8673 Personal history of transient ischemic attack (TIA), and cerebral infarction without residual deficits: Secondary | ICD-10-CM | POA: Diagnosis not present

## 2023-10-11 DIAGNOSIS — Z556 Problems related to health literacy: Secondary | ICD-10-CM | POA: Diagnosis not present

## 2023-10-11 DIAGNOSIS — Z87440 Personal history of urinary (tract) infections: Secondary | ICD-10-CM | POA: Diagnosis not present

## 2023-10-15 ENCOUNTER — Ambulatory Visit (HOSPITAL_BASED_OUTPATIENT_CLINIC_OR_DEPARTMENT_OTHER)
Admission: RE | Admit: 2023-10-15 | Discharge: 2023-10-15 | Disposition: A | Discharge status: Home / Self Care | Source: Ambulatory Visit | Attending: Cardiology | Admitting: Cardiology

## 2023-10-15 ENCOUNTER — Ambulatory Visit (HOSPITAL_COMMUNITY)
Admission: RE | Admit: 2023-10-15 | Discharge: 2023-10-15 | Disposition: A | Discharge status: Home / Self Care | Source: Ambulatory Visit | Attending: Cardiology | Admitting: Cardiology

## 2023-10-15 ENCOUNTER — Ambulatory Visit: Payer: Self-pay | Admitting: Neurology

## 2023-10-15 DIAGNOSIS — R269 Unspecified abnormalities of gait and mobility: Secondary | ICD-10-CM

## 2023-10-15 DIAGNOSIS — I10 Essential (primary) hypertension: Secondary | ICD-10-CM | POA: Insufficient documentation

## 2023-10-15 DIAGNOSIS — I679 Cerebrovascular disease, unspecified: Secondary | ICD-10-CM

## 2023-10-15 DIAGNOSIS — I5030 Unspecified diastolic (congestive) heart failure: Secondary | ICD-10-CM | POA: Diagnosis not present

## 2023-10-15 DIAGNOSIS — E119 Type 2 diabetes mellitus without complications: Secondary | ICD-10-CM | POA: Diagnosis not present

## 2023-10-15 DIAGNOSIS — M47812 Spondylosis without myelopathy or radiculopathy, cervical region: Secondary | ICD-10-CM

## 2023-10-15 DIAGNOSIS — I082 Rheumatic disorders of both aortic and tricuspid valves: Secondary | ICD-10-CM

## 2023-10-15 LAB — ECHOCARDIOGRAM COMPLETE
AR max vel: 1.42 cm2
AV Area VTI: 1.4 cm2
AV Area mean vel: 1.4 cm2
AV Mean grad: 6 mmHg
AV Peak grad: 10.2 mmHg
Ao pk vel: 1.6 m/s
Area-P 1/2: 2.73 cm2
MV M vel: 2.04 m/s
MV Peak grad: 16.6 mmHg

## 2023-10-26 DIAGNOSIS — E119 Type 2 diabetes mellitus without complications: Secondary | ICD-10-CM | POA: Diagnosis not present

## 2023-11-20 DIAGNOSIS — I872 Venous insufficiency (chronic) (peripheral): Secondary | ICD-10-CM | POA: Diagnosis not present

## 2023-11-20 DIAGNOSIS — E1169 Type 2 diabetes mellitus with other specified complication: Secondary | ICD-10-CM | POA: Diagnosis not present

## 2023-11-20 DIAGNOSIS — R600 Localized edema: Secondary | ICD-10-CM | POA: Diagnosis not present

## 2023-11-20 DIAGNOSIS — I10 Essential (primary) hypertension: Secondary | ICD-10-CM | POA: Diagnosis not present

## 2023-11-20 DIAGNOSIS — F01A0 Vascular dementia, mild, without behavioral disturbance, psychotic disturbance, mood disturbance, and anxiety: Secondary | ICD-10-CM | POA: Diagnosis not present

## 2023-11-26 ENCOUNTER — Ambulatory Visit: Admitting: Podiatry

## 2023-11-26 ENCOUNTER — Encounter: Payer: Self-pay | Admitting: Podiatry

## 2023-11-26 DIAGNOSIS — E1151 Type 2 diabetes mellitus with diabetic peripheral angiopathy without gangrene: Secondary | ICD-10-CM | POA: Diagnosis not present

## 2023-11-26 DIAGNOSIS — M79674 Pain in right toe(s): Secondary | ICD-10-CM

## 2023-11-26 DIAGNOSIS — I890 Lymphedema, not elsewhere classified: Secondary | ICD-10-CM | POA: Diagnosis not present

## 2023-11-26 DIAGNOSIS — M79675 Pain in left toe(s): Secondary | ICD-10-CM

## 2023-11-26 DIAGNOSIS — B351 Tinea unguium: Secondary | ICD-10-CM | POA: Diagnosis not present

## 2023-11-26 NOTE — Progress Notes (Signed)
This patient returns to my office for at risk foot care.  This patient requires this care by a professional since this patient will be at risk due to having diabetes and kidney injury.  This patient is unable to cut nails herself since the patient cannot reach her nails.These nails are painful walking and wearing shoes.  This patient presents for at risk foot care today.  General Appearance  Alert, conversant and in no acute stress.  Vascular  Dorsalis pedis and posterior tibial  pulses are not  palpable  Bilaterally  Due to swelling..  Capillary return is within normal limits  bilaterally. Temperature is within normal limits  bilaterally.  Neurologic  Senn-Weinstein monofilament wire test within normal limits  bilaterally. Muscle power within normal limits bilaterally.  Nails Thick disfigured discolored nails with subungual debris  Hallux nails  B/L.Marland Kitchen No evidence of bacterial infection or drainage bilaterally.  Orthopedic  No limitations of motion  feet .  No crepitus or effusions noted.  No bony pathology or digital deformities noted.  Pes planus.  Skin  normotropic skin with no porokeratosis noted bilaterally.  No signs of infections or ulcers noted.     Onychomycosis  Pain in right toes  Pain in left toes  Consent was obtained for treatment procedures.   Mechanical debridement of hallux nails   bilaterally performed with a nail nipper.     Return office visit  3 months                    Told patient to return for periodic foot care and evaluation due to potential at risk complications.   Helane Gunther DPM

## 2023-12-12 ENCOUNTER — Ambulatory Visit

## 2023-12-12 NOTE — Progress Notes (Signed)
 Patient presents to the office today for diabetic shoe and insole measuring.  Patient was measured with brannock device to determine size and width for 1 pair of extra depth shoes and foam casted for 3 pair of insoles.   Documentation of medical necessity was taken by patient and her Daughter once recvd we will place order for shoes and inserts    [employer] and insoles will be ordered at that time and patient will be notified for an appointment for fitting when they arrive.   Shoe size (per patient): 8XWD Balance shoe same as last year      MAIL SHOES TO PATIENT

## 2024-01-14 ENCOUNTER — Telehealth: Payer: Self-pay

## 2024-01-14 NOTE — Telephone Encounter (Signed)
 Called patient- DM shoe ppwk is here but we are missing chart notes and second page. Patients daughter said she will get in touch with [place] primary care office so they can send that information back.

## 2024-02-25 DIAGNOSIS — F01A0 Vascular dementia, mild, without behavioral disturbance, psychotic disturbance, mood disturbance, and anxiety: Secondary | ICD-10-CM | POA: Diagnosis not present

## 2024-02-25 DIAGNOSIS — I872 Venous insufficiency (chronic) (peripheral): Secondary | ICD-10-CM | POA: Diagnosis not present

## 2024-02-25 DIAGNOSIS — R600 Localized edema: Secondary | ICD-10-CM | POA: Diagnosis not present

## 2024-02-25 DIAGNOSIS — E1169 Type 2 diabetes mellitus with other specified complication: Secondary | ICD-10-CM | POA: Diagnosis not present

## 2024-02-25 DIAGNOSIS — I10 Essential (primary) hypertension: Secondary | ICD-10-CM | POA: Diagnosis not present

## 2024-02-26 ENCOUNTER — Ambulatory Visit: Admitting: Podiatry

## 2024-02-26 ENCOUNTER — Encounter: Payer: Self-pay | Admitting: Podiatry

## 2024-02-26 DIAGNOSIS — B351 Tinea unguium: Secondary | ICD-10-CM | POA: Diagnosis not present

## 2024-02-26 DIAGNOSIS — M79674 Pain in right toe(s): Secondary | ICD-10-CM | POA: Diagnosis not present

## 2024-02-26 DIAGNOSIS — M79675 Pain in left toe(s): Secondary | ICD-10-CM

## 2024-02-26 DIAGNOSIS — E1151 Type 2 diabetes mellitus with diabetic peripheral angiopathy without gangrene: Secondary | ICD-10-CM | POA: Diagnosis not present

## 2024-02-26 NOTE — Progress Notes (Signed)
This patient returns to my office for at risk foot care.  This patient requires this care by a professional since this patient will be at risk due to having diabetes and kidney injury.  This patient is unable to cut nails herself since the patient cannot reach her nails.These nails are painful walking and wearing shoes.  This patient presents for at risk foot care today.  General Appearance  Alert, conversant and in no acute stress.  Vascular  Dorsalis pedis and posterior tibial  pulses are not  palpable  Bilaterally  Due to swelling..  Capillary return is within normal limits  bilaterally. Temperature is within normal limits  bilaterally.  Neurologic  Senn-Weinstein monofilament wire test within normal limits  bilaterally. Muscle power within normal limits bilaterally.  Nails Thick disfigured discolored nails with subungual debris  Hallux nails  B/L.Marland Kitchen No evidence of bacterial infection or drainage bilaterally.  Orthopedic  No limitations of motion  feet .  No crepitus or effusions noted.  No bony pathology or digital deformities noted.  Pes planus.  Skin  normotropic skin with no porokeratosis noted bilaterally.  No signs of infections or ulcers noted.     Onychomycosis  Pain in right toes  Pain in left toes  Consent was obtained for treatment procedures.   Mechanical debridement of hallux nails   bilaterally performed with a nail nipper.     Return office visit  3 months                    Told patient to return for periodic foot care and evaluation due to potential at risk complications.   Helane Gunther DPM

## 2024-05-28 ENCOUNTER — Ambulatory Visit: Admitting: Podiatry

## 2024-06-18 ENCOUNTER — Ambulatory Visit: Admitting: Podiatry
